# Patient Record
Sex: Female | Born: 1990 | Race: Black or African American | Hispanic: No | Marital: Single | Smoking: Former smoker
Health system: Southern US, Community
[De-identification: ages and names within clinical notes are randomized; demographics above are authoritative.]

## PROBLEM LIST (undated history)

## (undated) DIAGNOSIS — D649 Anemia, unspecified: Secondary | ICD-10-CM

## (undated) HISTORY — PX: FOOT SURGERY: SHX648

---
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
## 2010-06-04 ENCOUNTER — Emergency Department (HOSPITAL_COMMUNITY)
Admission: EM | Admit: 2010-06-04 | Discharge: 2010-06-04 | Payer: Self-pay | Source: Home / Self Care | Admitting: Emergency Medicine

## 2010-06-05 LAB — URINALYSIS, ROUTINE W REFLEX MICROSCOPIC
Bilirubin Urine: NEGATIVE
Ketones, ur: 15 mg/dL — AB
Leukocytes, UA: NEGATIVE
Nitrite: NEGATIVE
Protein, ur: 30 mg/dL — AB
Specific Gravity, Urine: >1.03 — ABNORMAL HIGH (ref 1.005–1.030)
Urine Glucose, Fasting: NEGATIVE mg/dL
Urobilinogen, UA: 0.2 mg/dL (ref 0.0–1.0)
pH: 5.5 (ref 5.0–8.0)

## 2010-06-05 LAB — BASIC METABOLIC PANEL
BUN: 8 mg/dL (ref 6–23)
CO2: 22 mEq/L (ref 19–32)
Calcium: 8.7 mg/dL (ref 8.4–10.5)
Chloride: 104 mEq/L (ref 96–112)
Creatinine, Ser: 0.84 mg/dL (ref 0.4–1.2)
GFR calc Af Amer: >60 mL/min (ref >60)
GFR calc non Af Amer: >60 mL/min (ref >60)
Glucose, Bld: 98 mg/dL (ref 70–99)
Potassium: 3.4 mEq/L — ABNORMAL LOW (ref 3.5–5.1)
Sodium: 136 mEq/L (ref 135–145)

## 2010-06-05 LAB — CBC
HCT: 29.9 % — ABNORMAL LOW (ref 36.0–46.0)
Hemoglobin: 9.5 g/dL — ABNORMAL LOW (ref 12.0–15.0)
MCH: 22 pg — ABNORMAL LOW (ref 26.0–34.0)
MCHC: 31.8 g/dL (ref 30.0–36.0)
MCV: 69.2 fL — ABNORMAL LOW (ref 78.0–100.0)
Platelets: 251 10*3/uL (ref 150–400)
RBC: 4.32 MIL/uL (ref 3.87–5.11)
RDW: 20 % — ABNORMAL HIGH (ref 11.5–15.5)
WBC: 13.3 10*3/uL — ABNORMAL HIGH (ref 4.0–10.5)

## 2010-06-05 LAB — DIFFERENTIAL
Basophils Absolute: 0 10*3/uL (ref 0.0–0.1)
Basophils Relative: 0 % (ref 0–1)
Eosinophils Absolute: 0.1 10*3/uL (ref 0.0–0.7)
Eosinophils Relative: 1 % (ref 0–5)
Lymphocytes Relative: 8 % — ABNORMAL LOW (ref 12–46)
Lymphs Abs: 1 10*3/uL (ref 0.7–4.0)
Monocytes Absolute: 0.9 10*3/uL (ref 0.1–1.0)
Monocytes Relative: 7 % (ref 3–12)
Neutro Abs: 11.2 10*3/uL — ABNORMAL HIGH (ref 1.7–7.7)
Neutrophils Relative %: 84 % — ABNORMAL HIGH (ref 43–77)

## 2010-06-05 LAB — URINE MICROSCOPIC-ADD ON

## 2010-06-05 LAB — OCCULT BLOOD, POC DEVICE: Fecal Occult Bld: POSITIVE

## 2010-06-05 LAB — POCT PREGNANCY, URINE: Preg Test, Ur: NEGATIVE

## 2010-06-07 LAB — URINE CULTURE
Colony Count: >=100000
Culture  Setup Time: 201201160040

## 2010-08-05 LAB — URINALYSIS, ROUTINE W REFLEX MICROSCOPIC
Glucose, UA: NEGATIVE mg/dL
Ketones, ur: 15 mg/dL — AB
Nitrite: NEGATIVE
Protein, ur: NEGATIVE mg/dL
Specific Gravity, Urine: 1.031 — ABNORMAL HIGH (ref 1.005–1.030)
Urobilinogen, UA: 1 mg/dL (ref 0.0–1.0)
pH: 5.5 (ref 5.0–8.0)

## 2010-08-05 LAB — DIFFERENTIAL
Basophils Absolute: 0 10*3/uL (ref 0.0–0.1)
Basophils Relative: 0 % (ref 0–1)
Eosinophils Absolute: 0.1 10*3/uL (ref 0.0–0.7)
Eosinophils Relative: 1 % (ref 0–5)
Lymphocytes Relative: 15 % (ref 12–46)
Lymphs Abs: 1.5 10*3/uL (ref 0.7–4.0)
Monocytes Absolute: 1.1 10*3/uL — ABNORMAL HIGH (ref 0.1–1.0)
Monocytes Relative: 10 % (ref 3–12)
Neutro Abs: 7.5 10*3/uL (ref 1.7–7.7)
Neutrophils Relative %: 73 % (ref 43–77)

## 2010-08-05 LAB — URINE MICROSCOPIC-ADD ON

## 2010-08-05 LAB — CBC
HCT: 27.9 % — ABNORMAL LOW (ref 36.0–46.0)
Hemoglobin: 9.1 g/dL — ABNORMAL LOW (ref 12.0–15.0)
MCH: 23.7 pg — ABNORMAL LOW (ref 26.0–34.0)
MCHC: 32.7 g/dL (ref 30.0–36.0)
MCV: 72.5 fL — ABNORMAL LOW (ref 78.0–100.0)
Platelets: 275 10*3/uL (ref 150–400)
RBC: 3.85 MIL/uL — ABNORMAL LOW (ref 3.87–5.11)
RDW: 18.3 % — ABNORMAL HIGH (ref 11.5–15.5)
WBC: 10.2 10*3/uL (ref 4.0–10.5)

## 2010-08-05 LAB — WET PREP, GENITAL
Trich, Wet Prep: NONE SEEN
Yeast Wet Prep HPF POC: NONE SEEN

## 2010-08-05 LAB — POCT I-STAT, CHEM 8
BUN: 16 mg/dL (ref 6–23)
Calcium, Ion: 1.22 mmol/L (ref 1.12–1.32)
Chloride: 108 mEq/L (ref 96–112)
Creatinine, Ser: 1 mg/dL (ref 0.4–1.2)
Glucose, Bld: 94 mg/dL (ref 70–99)
HCT: 31 % — ABNORMAL LOW (ref 36.0–46.0)
Hemoglobin: 10.5 g/dL — ABNORMAL LOW (ref 12.0–15.0)
Potassium: 3.8 mEq/L (ref 3.5–5.1)
Sodium: 142 mEq/L (ref 135–145)
TCO2: 25 mmol/L (ref 0–100)

## 2010-08-05 LAB — GC/CHLAMYDIA PROBE AMP, GENITAL
Chlamydia, DNA Probe: POSITIVE — AB
GC Probe Amp, Genital: POSITIVE — AB

## 2010-08-05 LAB — POCT PREGNANCY, URINE: Preg Test, Ur: NEGATIVE

## 2010-08-10 ENCOUNTER — Inpatient Hospital Stay (INDEPENDENT_AMBULATORY_CARE_PROVIDER_SITE_OTHER)
Admission: RE | Admit: 2010-08-10 | Discharge: 2010-08-10 | Disposition: A | Discharge status: Home / Self Care | Payer: Self-pay | Source: Ambulatory Visit | Attending: Family Medicine | Admitting: Family Medicine

## 2010-08-10 DIAGNOSIS — N739 Female pelvic inflammatory disease, unspecified: Secondary | ICD-10-CM

## 2010-08-10 LAB — POCT PREGNANCY, URINE: Preg Test, Ur: NEGATIVE

## 2010-08-10 LAB — POCT URINALYSIS DIP (DEVICE)
Bilirubin Urine: NEGATIVE
Glucose, UA: NEGATIVE mg/dL
Ketones, ur: NEGATIVE mg/dL
Nitrite: NEGATIVE
Protein, ur: NEGATIVE mg/dL
Specific Gravity, Urine: >=1.03 (ref 1.005–1.030)
Urobilinogen, UA: 1 mg/dL (ref 0.0–1.0)
pH: 6 (ref 5.0–8.0)

## 2010-08-10 LAB — WET PREP, GENITAL
Trich, Wet Prep: NONE SEEN
WBC, Wet Prep HPF POC: NONE SEEN
Yeast Wet Prep HPF POC: NONE SEEN

## 2010-08-11 LAB — GC/CHLAMYDIA PROBE AMP, GENITAL
Chlamydia, DNA Probe: NEGATIVE
GC Probe Amp, Genital: NEGATIVE

## 2011-01-15 ENCOUNTER — Emergency Department (HOSPITAL_COMMUNITY): Payer: Self-pay

## 2011-01-15 ENCOUNTER — Emergency Department (HOSPITAL_COMMUNITY)
Admission: EM | Admit: 2011-01-15 | Discharge: 2011-01-15 | Disposition: A | Discharge status: Home / Self Care | Payer: Self-pay | Attending: Emergency Medicine | Admitting: Emergency Medicine

## 2011-01-15 DIAGNOSIS — R51 Headache: Secondary | ICD-10-CM | POA: Insufficient documentation

## 2011-01-15 DIAGNOSIS — IMO0001 Reserved for inherently not codable concepts without codable children: Secondary | ICD-10-CM | POA: Insufficient documentation

## 2011-01-15 DIAGNOSIS — R5383 Other fatigue: Secondary | ICD-10-CM | POA: Insufficient documentation

## 2011-01-15 DIAGNOSIS — J3489 Other specified disorders of nose and nasal sinuses: Secondary | ICD-10-CM | POA: Insufficient documentation

## 2011-01-15 DIAGNOSIS — R059 Cough, unspecified: Secondary | ICD-10-CM | POA: Insufficient documentation

## 2011-01-15 DIAGNOSIS — R110 Nausea: Secondary | ICD-10-CM | POA: Insufficient documentation

## 2011-01-15 DIAGNOSIS — J189 Pneumonia, unspecified organism: Secondary | ICD-10-CM | POA: Insufficient documentation

## 2011-01-15 DIAGNOSIS — R05 Cough: Secondary | ICD-10-CM | POA: Insufficient documentation

## 2011-01-15 DIAGNOSIS — R070 Pain in throat: Secondary | ICD-10-CM | POA: Insufficient documentation

## 2011-01-15 DIAGNOSIS — R5381 Other malaise: Secondary | ICD-10-CM | POA: Insufficient documentation

## 2011-01-15 LAB — URINALYSIS, ROUTINE W REFLEX MICROSCOPIC
Ketones, ur: 15 mg/dL — AB
Leukocytes, UA: NEGATIVE
Nitrite: NEGATIVE
Protein, ur: NEGATIVE mg/dL
Urobilinogen, UA: 0.2 mg/dL (ref 0.0–1.0)

## 2011-01-15 LAB — RAPID STREP SCREEN (MED CTR MEBANE ONLY): Streptococcus, Group A Screen (Direct): NEGATIVE

## 2011-01-16 LAB — URINE CULTURE
Colony Count: >=100000
Culture  Setup Time: 201208271825

## 2011-05-12 ENCOUNTER — Emergency Department (HOSPITAL_COMMUNITY)
Admission: EM | Admit: 2011-05-12 | Discharge: 2011-05-12 | Disposition: A | Discharge status: Home / Self Care | Payer: Self-pay | Attending: Emergency Medicine | Admitting: Emergency Medicine

## 2011-05-12 ENCOUNTER — Emergency Department (HOSPITAL_COMMUNITY): Payer: Self-pay

## 2011-05-12 DIAGNOSIS — R509 Fever, unspecified: Secondary | ICD-10-CM | POA: Insufficient documentation

## 2011-05-12 DIAGNOSIS — J45909 Unspecified asthma, uncomplicated: Secondary | ICD-10-CM | POA: Insufficient documentation

## 2011-05-12 DIAGNOSIS — IMO0001 Reserved for inherently not codable concepts without codable children: Secondary | ICD-10-CM | POA: Insufficient documentation

## 2011-05-12 DIAGNOSIS — J069 Acute upper respiratory infection, unspecified: Secondary | ICD-10-CM | POA: Insufficient documentation

## 2011-05-12 DIAGNOSIS — R51 Headache: Secondary | ICD-10-CM | POA: Insufficient documentation

## 2011-05-12 DIAGNOSIS — J3489 Other specified disorders of nose and nasal sinuses: Secondary | ICD-10-CM | POA: Insufficient documentation

## 2011-05-12 DIAGNOSIS — R112 Nausea with vomiting, unspecified: Secondary | ICD-10-CM | POA: Insufficient documentation

## 2011-05-12 MED ORDER — IBUPROFEN 200 MG PO TABS
600.0000 mg | ORAL_TABLET | Freq: Once | ORAL | Status: AC
  Administered 2011-05-12: 600 mg via ORAL
  Filled 2011-05-12: qty 3

## 2011-05-12 MED ORDER — ALBUTEROL SULFATE (5 MG/ML) 0.5% IN NEBU
2.5000 mg | INHALATION_SOLUTION | Freq: Once | RESPIRATORY_TRACT | Status: AC
  Administered 2011-05-12: 2.5 mg via RESPIRATORY_TRACT
  Filled 2011-05-12: qty 0.5

## 2011-05-12 MED ORDER — BENZONATATE 100 MG PO CAPS: 100.0000 mg | ORAL_CAPSULE | Freq: Three times a day (TID) | ORAL | Status: AC

## 2011-05-12 MED ORDER — IPRATROPIUM BROMIDE 0.02 % IN SOLN
0.5000 mg | Freq: Once | RESPIRATORY_TRACT | Status: AC
  Administered 2011-05-12: 0.5 mg via RESPIRATORY_TRACT
  Filled 2011-05-12: qty 2.5

## 2011-05-12 MED ORDER — ALBUTEROL SULFATE HFA 108 (90 BASE) MCG/ACT IN AERS
1.0000 | INHALATION_SPRAY | RESPIRATORY_TRACT | Status: DC
  Administered 2011-05-12: 2 via RESPIRATORY_TRACT
  Filled 2011-05-12: qty 6.7

## 2011-05-12 MED ORDER — GUAIFENESIN 100 MG/5ML PO LIQD: 100.0000 mg | ORAL | Status: AC | PRN

## 2011-05-12 NOTE — ED Notes (Signed)
Neb treatment completed at this time, Pt resting with eyes closed.

## 2011-05-12 NOTE — ED Provider Notes (Signed)
Medical screening examination/treatment/procedure(s) were performed by non-physician practitioner and as supervising physician I was immediately available for consultation/collaboration.   Jalila Goodnough, MD 05/12/11 2200 

## 2011-05-12 NOTE — ED Notes (Signed)
Chills, fever, n/v/d and body aches for 3 days
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
## 2011-05-12 NOTE — ED Provider Notes (Signed)
History     CSN: [id number]  Arrival date & time 05/12/11  0901   First MD Initiated Contact with Patient 05/12/11 1054      Chief Complaint  Patient presents with  . Influenza    (Consider location/radiation/quality/duration/timing/severity/associated sxs/prior treatment) Patient is a 20 y.o. female presenting with flu symptoms.  Influenza Associated symptoms include chills, congestion, a fever, headaches and myalgias. Pertinent negatives include no abdominal pain, chest pain, fatigue, nausea, neck pain, rash, sore throat, vomiting or weakness.  History is obtained from the patient and mom. She has had flulike symptoms for the past 3 days. These have consisted of chills, nausea and vomiting, and body aches. Mom states that she had a "high fever "last evening, she was very warm to touch; however, she did not measure the temperature. She is taking over-the-counter medications, which have not helped.  She additionally complains of a headache which has been there for the past 3 days. It is frontal in nature and located behind the left eye. It is throbbing in nature. She has no history of migraines. Denies sinus pain or pressure. Headache does not seem to wax and wane in intensity. She denies photophobia/photophobia. Denies vertigo, weakness.  Past Medical History  Diagnosis Date  . Asthma     History reviewed. No pertinent past surgical history.  History reviewed. No pertinent family history.  History  Substance Use Topics  . Smoking status: Current Everyday Smoker  . Smokeless tobacco: Not on file  . Alcohol Use: Yes    OB History    Grav Para Term Preterm Abortions TAB SAB Ect Mult Living                  Review of Systems  Constitutional: Positive for fever, chills and appetite change. Negative for activity change, fatigue and unexpected weight change.  HENT: Positive for congestion and rhinorrhea. Negative for hearing loss, ear pain, sore throat, trouble swallowing,  neck pain, neck stiffness and sinus pressure.   Eyes: Negative for photophobia and visual disturbance.  Respiratory: Negative for chest tightness and shortness of breath.   Cardiovascular: Negative for chest pain and palpitations.  Gastrointestinal: Negative for nausea, vomiting and abdominal pain.  Genitourinary: Negative for dysuria.  Musculoskeletal: Positive for myalgias.  Skin: Negative for color change and rash.  Neurological: Positive for headaches. Negative for dizziness and weakness.    Allergies  Review of patient's allergies indicates no known allergies.  Home Medications  No current outpatient prescriptions on file.  BP 127/77  Pulse 98  Temp(Src) 99.1 F (37.3 C) (Oral)  Resp 18  SpO2 98%  LMP 04/21/2011  Physical Exam  Nursing note and vitals reviewed. Constitutional: She is oriented to person, place, and time. She appears well-developed and well-nourished. No distress.  HENT:  Head: Normocephalic and atraumatic.  Right Ear: External ear normal.  Left Ear: External ear normal.  Nose: Nose normal.  Mouth/Throat: Oropharynx is clear and moist. No oropharyngeal exudate.       No tenderness to palp on scalp palpation  Eyes: Conjunctivae and EOM are normal. Pupils are equal, round, and reactive to light.  Neck: Normal range of motion. Neck supple. No tracheal deviation present.  Cardiovascular: Normal rate, regular rhythm and normal heart sounds.  Exam reveals no gallop and no friction rub.   No murmur heard. Pulmonary/Chest: Effort normal. No respiratory distress. She has wheezes. She exhibits no tenderness.       Diffuse wheezing worst in upper lung fields b/l  Abdominal: Soft. Bowel sounds are normal. There is no tenderness. There is no rebound and no guarding.  Musculoskeletal: Normal range of motion.  Lymphadenopathy:    She has no cervical adenopathy.  Neurological: She is alert and oriented to person, place, and time. No cranial nerve deficit.  Skin: Skin  is warm and dry. No rash noted. She is not diaphoretic.  Psychiatric: She has a normal mood and affect.    ED Course  Procedures (including critical care time)  Labs Reviewed - No data to display Dg Chest 2 View  05/12/2011  *RADIOLOGY REPORT*  Clinical Data: Wheezing, cough and fever.  CHEST - 2 VIEW  Comparison: PA and lateral chest 01/15/2011.  Findings: Left lower lobe airspace disease seen on the prior study has resolved.  Lungs are clear.  Heart size normal.  No pneumothorax or effusion.  IMPRESSION: No acute disease.  Original Report Authenticated By: Bernadene Bell. D'ALESSIO, M.D.     1. URI (upper respiratory infection)       MDM  11:53 AM Patient assessed. VSS, nontoxic appearing. She is noted to have wheezing bilaterally, worse in the upper lung fields. She does have a history of asthma. States that she does not have an inhaler to use at home. Plan to obtain chest x-ray to rule out pneumonia or other acute infectious process.  2:21 PM Patient's chest x-ray was clear. She was given one nebulizer treatment, and lung sounds were considerably clearer afterwards. Vital signs are stable. Suspect viral infection. Will plan to treat symptomatically. Findings and plan discussed with patient and mom at bedside. They verbalized understanding and agreed to plan.      Grant Fontana, [place] 05/12/11 1705

## 2011-07-28 ENCOUNTER — Emergency Department (HOSPITAL_COMMUNITY): Payer: Self-pay

## 2011-07-28 ENCOUNTER — Encounter (HOSPITAL_COMMUNITY): Payer: Self-pay | Admitting: Emergency Medicine

## 2011-07-28 ENCOUNTER — Emergency Department (HOSPITAL_COMMUNITY)
Admission: EM | Admit: 2011-07-28 | Discharge: 2011-07-28 | Disposition: A | Discharge status: Home / Self Care | Payer: Self-pay | Attending: Emergency Medicine | Admitting: Emergency Medicine

## 2011-07-28 DIAGNOSIS — S022XXA Fracture of nasal bones, initial encounter for closed fracture: Secondary | ICD-10-CM | POA: Insufficient documentation

## 2011-07-28 DIAGNOSIS — R51 Headache: Secondary | ICD-10-CM | POA: Insufficient documentation

## 2011-07-28 DIAGNOSIS — R042 Hemoptysis: Secondary | ICD-10-CM | POA: Insufficient documentation

## 2011-07-28 DIAGNOSIS — S0083XA Contusion of other part of head, initial encounter: Secondary | ICD-10-CM

## 2011-07-28 DIAGNOSIS — S0003XA Contusion of scalp, initial encounter: Secondary | ICD-10-CM | POA: Insufficient documentation

## 2011-07-28 LAB — URINE MICROSCOPIC-ADD ON

## 2011-07-28 LAB — URINALYSIS, ROUTINE W REFLEX MICROSCOPIC
Bilirubin Urine: NEGATIVE
Ketones, ur: NEGATIVE mg/dL
Nitrite: NEGATIVE
Urobilinogen, UA: 0.2 mg/dL (ref 0.0–1.0)
pH: 7 (ref 5.0–8.0)

## 2011-07-28 MED ORDER — HYDROCODONE-ACETAMINOPHEN 5-325 MG PO TABS: ORAL_TABLET | ORAL | Status: AC
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
## 2011-07-28 NOTE — Discharge Instructions (Signed)
RESOURCE GUIDE  Dental Problems  Patients with Medicaid: [facility]                     [facility] [address]                                           [address] Phone:  [phone]                                                  Phone:  [phone]  If unable to pay or uninsured, contact:  [facility] or [facility] to become qualified for the adult dental clinic.  Chronic Pain Problems Contact [facility]  [phone] Patients need to be referred by their primary care doctor.  Insufficient Money for Medicine Contact [employer]:  call "211" or [facility] 271-5999.  No Primary Care Doctor Call [facility]  832-8000 Other agencies that provide inexpensive medical care    [facility]  [phone]    [facility]  [phone]    [facility]  [phone]    Women[facility] Clinic  [phone]    [facility]  [phone]    [facility]  [phone]  Psychological Services [facility]  [phone] [facility]  [phone] [facility]   800 853-5163 (emergency services 641-4993)  Substance Abuse Resources [facility]  336-882-2125 [facility] 336-784-9470 [facility] 336-285-9073 [facility] 336-845-3988 Residential & Outpatient Substance Abuse Program  800-659-3381  Abuse/Neglect [place] Child Abuse Hotline (336) 641-3795 [place] Child Abuse Hotline 800-378-5315 (After Hours)  Emergency Shelter [facility] (336) 271-5985  Maternity Homes [facility] (336) 275-9566 [facility] (704) 372-4663  MRSA Hotline #:   832-7006    [place] Resources  [facility] [place]     [employer]                          [facility] [address] [place]                       [address]      [address]  [place]                                                [place]                            [place] Phone:  [phone]                                   Phone:  [phone]                 Phone:  [phone]  [facility] Phone:  [phone]    [facility] Child Abuse Hotline 605-317-0757 402-805-0899 (After Hours)    Take the prescription as directed.  You will need to keep your head elevated for the next few days, especially at night, to help decrease the swelling on your face.  Continue to apply ice several times per day for 15 to 20 minutes each time.  Call your regular medical doctor on Monday to schedule a follow up appointment this week.   You will also want to call the ENT doctor on Monday to schedule a follow up appointment within the next week regarding your nasal bone fracture.  Return to the Emergency Department immediately if worsening.

## 2011-07-28 NOTE — ED Notes (Signed)
Patient transported to CT 
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
## 2011-12-05 ENCOUNTER — Emergency Department (HOSPITAL_COMMUNITY)
Admission: EM | Admit: 2011-12-05 | Discharge: 2011-12-05 | Disposition: A | Discharge status: Home / Self Care | Payer: Self-pay | Attending: Emergency Medicine | Admitting: Emergency Medicine

## 2011-12-05 ENCOUNTER — Encounter (HOSPITAL_COMMUNITY): Payer: Self-pay | Admitting: *Deleted

## 2011-12-05 DIAGNOSIS — J45909 Unspecified asthma, uncomplicated: Secondary | ICD-10-CM | POA: Insufficient documentation

## 2011-12-05 DIAGNOSIS — F17200 Nicotine dependence, unspecified, uncomplicated: Secondary | ICD-10-CM | POA: Insufficient documentation

## 2011-12-05 DIAGNOSIS — D649 Anemia, unspecified: Secondary | ICD-10-CM | POA: Insufficient documentation

## 2011-12-05 DIAGNOSIS — M549 Dorsalgia, unspecified: Secondary | ICD-10-CM | POA: Insufficient documentation

## 2011-12-05 DIAGNOSIS — N939 Abnormal uterine and vaginal bleeding, unspecified: Secondary | ICD-10-CM | POA: Insufficient documentation

## 2011-12-05 DIAGNOSIS — N390 Urinary tract infection, site not specified: Secondary | ICD-10-CM | POA: Insufficient documentation

## 2011-12-05 DIAGNOSIS — N926 Irregular menstruation, unspecified: Secondary | ICD-10-CM | POA: Insufficient documentation

## 2011-12-05 LAB — CBC WITH DIFFERENTIAL/PLATELET
Basophils Relative: 0 % (ref 0–1)
Eosinophils Relative: 1 % (ref 0–5)
Hemoglobin: 6.7 g/dL — CL (ref 12.0–15.0)
Lymphocytes Relative: 18 % (ref 12–46)
MCH: 18.8 pg — ABNORMAL LOW (ref 26.0–34.0)
Monocytes Absolute: 0.8 10*3/uL (ref 0.1–1.0)
Neutrophils Relative %: 73 % (ref 43–77)
RBC: 3.56 MIL/uL — ABNORMAL LOW (ref 3.87–5.11)

## 2011-12-05 LAB — URINALYSIS, ROUTINE W REFLEX MICROSCOPIC
Glucose, UA: NEGATIVE mg/dL
Specific Gravity, Urine: 1.029 (ref 1.005–1.030)
pH: 5 (ref 5.0–8.0)

## 2011-12-05 LAB — BASIC METABOLIC PANEL
CO2: 24 mEq/L (ref 19–32)
Chloride: 105 mEq/L (ref 96–112)
Potassium: 3.7 mEq/L (ref 3.5–5.1)
Sodium: 139 mEq/L (ref 135–145)

## 2011-12-05 LAB — URINE MICROSCOPIC-ADD ON

## 2011-12-05 LAB — WET PREP, GENITAL
Trich, Wet Prep: NONE SEEN
Yeast Wet Prep HPF POC: NONE SEEN

## 2011-12-05 MED ORDER — ONDANSETRON 4 MG PO TBDP
8.0000 mg | ORAL_TABLET | Freq: Once | ORAL | Status: AC
  Administered 2011-12-05: 8 mg via ORAL
  Filled 2011-12-05: qty 2

## 2011-12-05 MED ORDER — CIPROFLOXACIN HCL 500 MG PO TABS: 500.0000 mg | ORAL_TABLET | Freq: Two times a day (BID) | ORAL | Status: AC

## 2011-12-05 MED ORDER — CIPROFLOXACIN HCL 500 MG PO TABS
500.0000 mg | ORAL_TABLET | Freq: Once | ORAL | Status: AC
  Administered 2011-12-05: 500 mg via ORAL
  Filled 2011-12-05: qty 1

## 2011-12-05 MED ORDER — IBUPROFEN 600 MG PO TABS: 600.0000 mg | ORAL_TABLET | Freq: Four times a day (QID) | ORAL | Status: AC | PRN

## 2011-12-05 MED ORDER — MEDROXYPROGESTERONE ACETATE 5 MG PO TABS: 5.0000 mg | ORAL_TABLET | Freq: Every day | ORAL | Status: DC

## 2011-12-05 MED ORDER — IBUPROFEN 800 MG PO TABS
800.0000 mg | ORAL_TABLET | Freq: Once | ORAL | Status: AC
Start: 2011-12-05 — End: 2011-12-05
  Administered 2011-12-05: 800 mg via ORAL
  Filled 2011-12-05: qty 1

## 2011-12-05 NOTE — ED Provider Notes (Signed)
History     CSN: [id number]  Arrival date & time 12/05/11  0865   First MD Initiated Contact with Patient 12/05/11 1045      Chief Complaint  Patient presents with  . Abdominal Pain  . Back Pain    (Consider location/radiation/quality/duration/timing/severity/associated sxs/prior treatment) Patient is a 21 y.o. female presenting with abdominal pain and back pain. The history is provided by the patient. No language interpreter was used.  Abdominal Pain The primary symptoms of the illness include abdominal pain, nausea and vaginal bleeding. The primary symptoms of the illness do not include fever, vomiting, diarrhea or vaginal discharge. The current episode started more than 2 days ago. The onset of the illness was gradual.  Additional symptoms associated with the illness include back pain.  Back Pain  Associated symptoms include abdominal pain. Pertinent negatives include no fever.   Reports second day of her period with heavy bleeding as usual and suprapubic pain.  Nausea but no vomiting. States that the suprapubic pain has been for 5-6 days.  Denies dysuria, discharge, fever, dizziness  or severe pain.  She has taken nothing for pain.  States that she is anemic and should be on iron pills but quit taking them a year ago.  No pcp.   Past Medical History  Diagnosis Date  . Asthma     Past Surgical History  Procedure Date  . Foot surgery     No family history on file.  History  Substance Use Topics  . Smoking status: Current Everyday Smoker  . Smokeless tobacco: Not on file  . Alcohol Use: Yes    OB History    Grav Para Term Preterm Abortions TAB SAB Ect Mult Living                  Review of Systems  Constitutional: Negative for fever.  Gastrointestinal: Positive for nausea and abdominal pain. Negative for vomiting and diarrhea.  Genitourinary: Positive for vaginal bleeding. Negative for vaginal discharge.  Musculoskeletal: Positive for back pain.    Allergies   Review of patient's allergies indicates no known allergies.  Home Medications   Current Outpatient Rx  Name Route Sig Dispense Refill  . ALBUTEROL SULFATE HFA 108 (90 BASE) MCG/ACT IN AERS Inhalation Inhale 2 puffs into the lungs every 6 (six) hours as needed. For shortness of breath      BP 144/93  Pulse 92  Temp 99.2 F (37.3 C) (Oral)  Resp 16  Ht 5\' 4"  (1.626 m)  Wt 170 lb (77.111 kg)  BMI 29.18 kg/m2  SpO2 100%  Physical Exam  Nursing note and vitals reviewed. Constitutional: She is oriented to person, place, and time. She appears well-developed and well-nourished.  HENT:  Head: Normocephalic and atraumatic.  Eyes: Conjunctivae and EOM are normal. Pupils are equal, round, and reactive to light.  Neck: Normal range of motion. Neck supple.  Cardiovascular: Normal rate.   Pulmonary/Chest: Effort normal.  Abdominal: Soft. Bowel sounds are normal. She exhibits no distension. There is tenderness. There is no rebound and no guarding.  Musculoskeletal: Normal range of motion. She exhibits no edema and no tenderness.  Neurological: She is alert and oriented to person, place, and time. She has normal reflexes.  Skin: Skin is warm and dry.  Psychiatric: She has a normal mood and affect.    ED Course  Pelvic exam Date/Time: 12/05/2011 11:45 AM Performed by: Remi Haggard Authorized by: Remi Haggard Consent: Verbal consent obtained. Risks and benefits: risks, benefits  and alternatives were discussed Consent given by: patient Patient understanding: patient states understanding of the procedure being performed Patient identity confirmed: verbally with patient, arm band, provided demographic data and hospital-assigned identification number Time out: Immediately prior to procedure a "time out" was called to verify the correct patient, procedure, equipment, support staff and site/side marked as required. Preparation: Patient was prepped and draped in the usual sterile  fashion. Local anesthesia used: no Patient sedated: no Comments: Heavy vaginal bleeding.  Suprapubic pain on exam.     (including critical care time)  Labs Reviewed  CBC WITH DIFFERENTIAL - Abnormal; Notable for the following:    RBC 3.56 (*)     Hemoglobin 6.7 (*)     HCT 22.4 (*)     MCV 62.9 (*)     MCH 18.8 (*)     MCHC 29.9 (*)     RDW 19.6 (*)     All other components within normal limits  URINALYSIS, ROUTINE W REFLEX MICROSCOPIC - Abnormal; Notable for the following:    Color, Urine RED (*)  BIOCHEMICALS MAY BE AFFECTED BY COLOR   APPearance CLOUDY (*)     Hgb urine dipstick LARGE (*)     Bilirubin Urine LARGE (*)     Ketones, ur 40 (*)     Protein, ur >300 (*)     Nitrite POSITIVE (*)     Leukocytes, UA MODERATE (*)     All other components within normal limits  WET PREP, GENITAL - Abnormal; Notable for the following:    WBC, Wet Prep HPF POC FEW (*)     All other components within normal limits  URINE MICROSCOPIC-ADD ON - Abnormal; Notable for the following:    Squamous Epithelial / LPF FEW (*)     Bacteria, UA FEW (*)     All other components within normal limits  BASIC METABOLIC PANEL  PREGNANCY, URINE  GC/CHLAMYDIA PROBE AMP, GENITAL   No results found.   No diagnosis found.    MDM  UTI with heavy vaginal bleedging.  Anemic hgb 6.7.  Asymptomatic.  Will start taking her iron pills again and [facility]  Return if symptomatic.  Ibuprofen for pain.  Cipro for uti.  Non toxic appearance.  rx for provera for the heavy bleeding.  No insurance. No pcp.          Remi Haggard, NP 12/06/11 1250

## 2011-12-05 NOTE — ED Notes (Signed)
Patient complains of lower abd pain and lower back pain since last Thursday.  She started her period on yesterday.  She denies seeing discharge.  Patient states she has increased pain with cough/laugh

## 2011-12-05 NOTE — Progress Notes (Signed)
Met with patient to discuss her medical practices outside of coming to the ED. She does not have a PCP and states she does not have insurance. Patient does work but is not sure if they offer health insurance. Discussed [employer] and she expressed interest in learning about becoming enrolled. I have referred the patient to [facility].

## 2011-12-07 LAB — GC/CHLAMYDIA PROBE AMP, GENITAL: GC Probe Amp, Genital: POSITIVE — AB

## 2011-12-07 NOTE — ED Provider Notes (Signed)
Medical screening examination/treatment/procedure(s) were performed by non-physician practitioner and as supervising physician I was immediately available for consultation/collaboration.   Dione Booze, MD 12/07/11 1425

## 2011-12-08 NOTE — ED Notes (Signed)
+  Chlamydia and +Gonorrhea. Chart sent to EDP office for review. DHHS attached x 2. °

## 2011-12-11 NOTE — ED Notes (Signed)
rx for Azithromycin gram po daily once Doxycyline 100 mg twice daily for 7 daily called to [address]- [phone] by Patty PFM.

## 2012-07-25 IMAGING — CR DG CHEST 2V
2 series · 2 of 2 positions shown · non-contrast
Comparison: PA and lateral chest 01/15/2011.

CLINICAL DATA: Wheezing, cough and fever.

CHEST - 2 VIEW

[w chest pa]
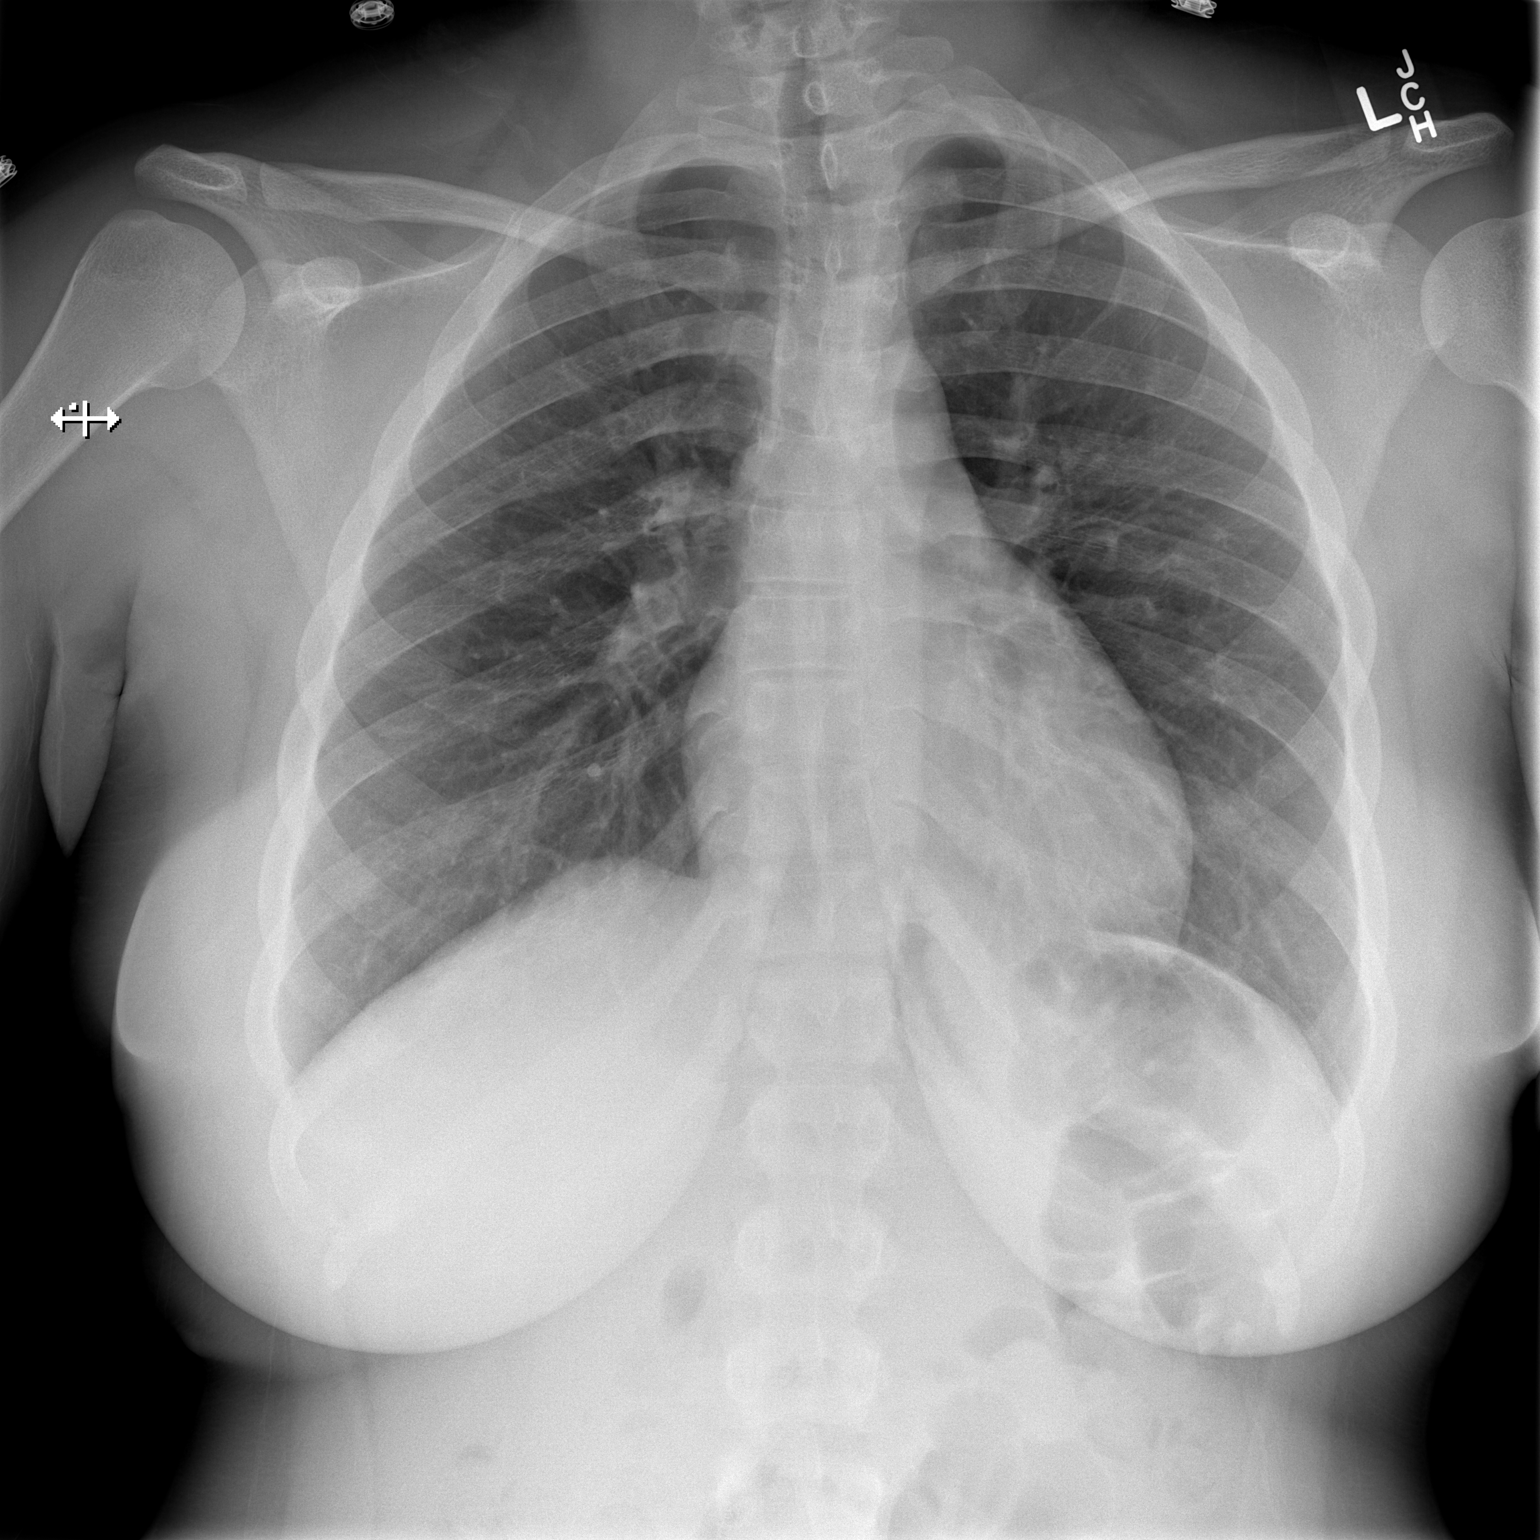

[w chest lat]
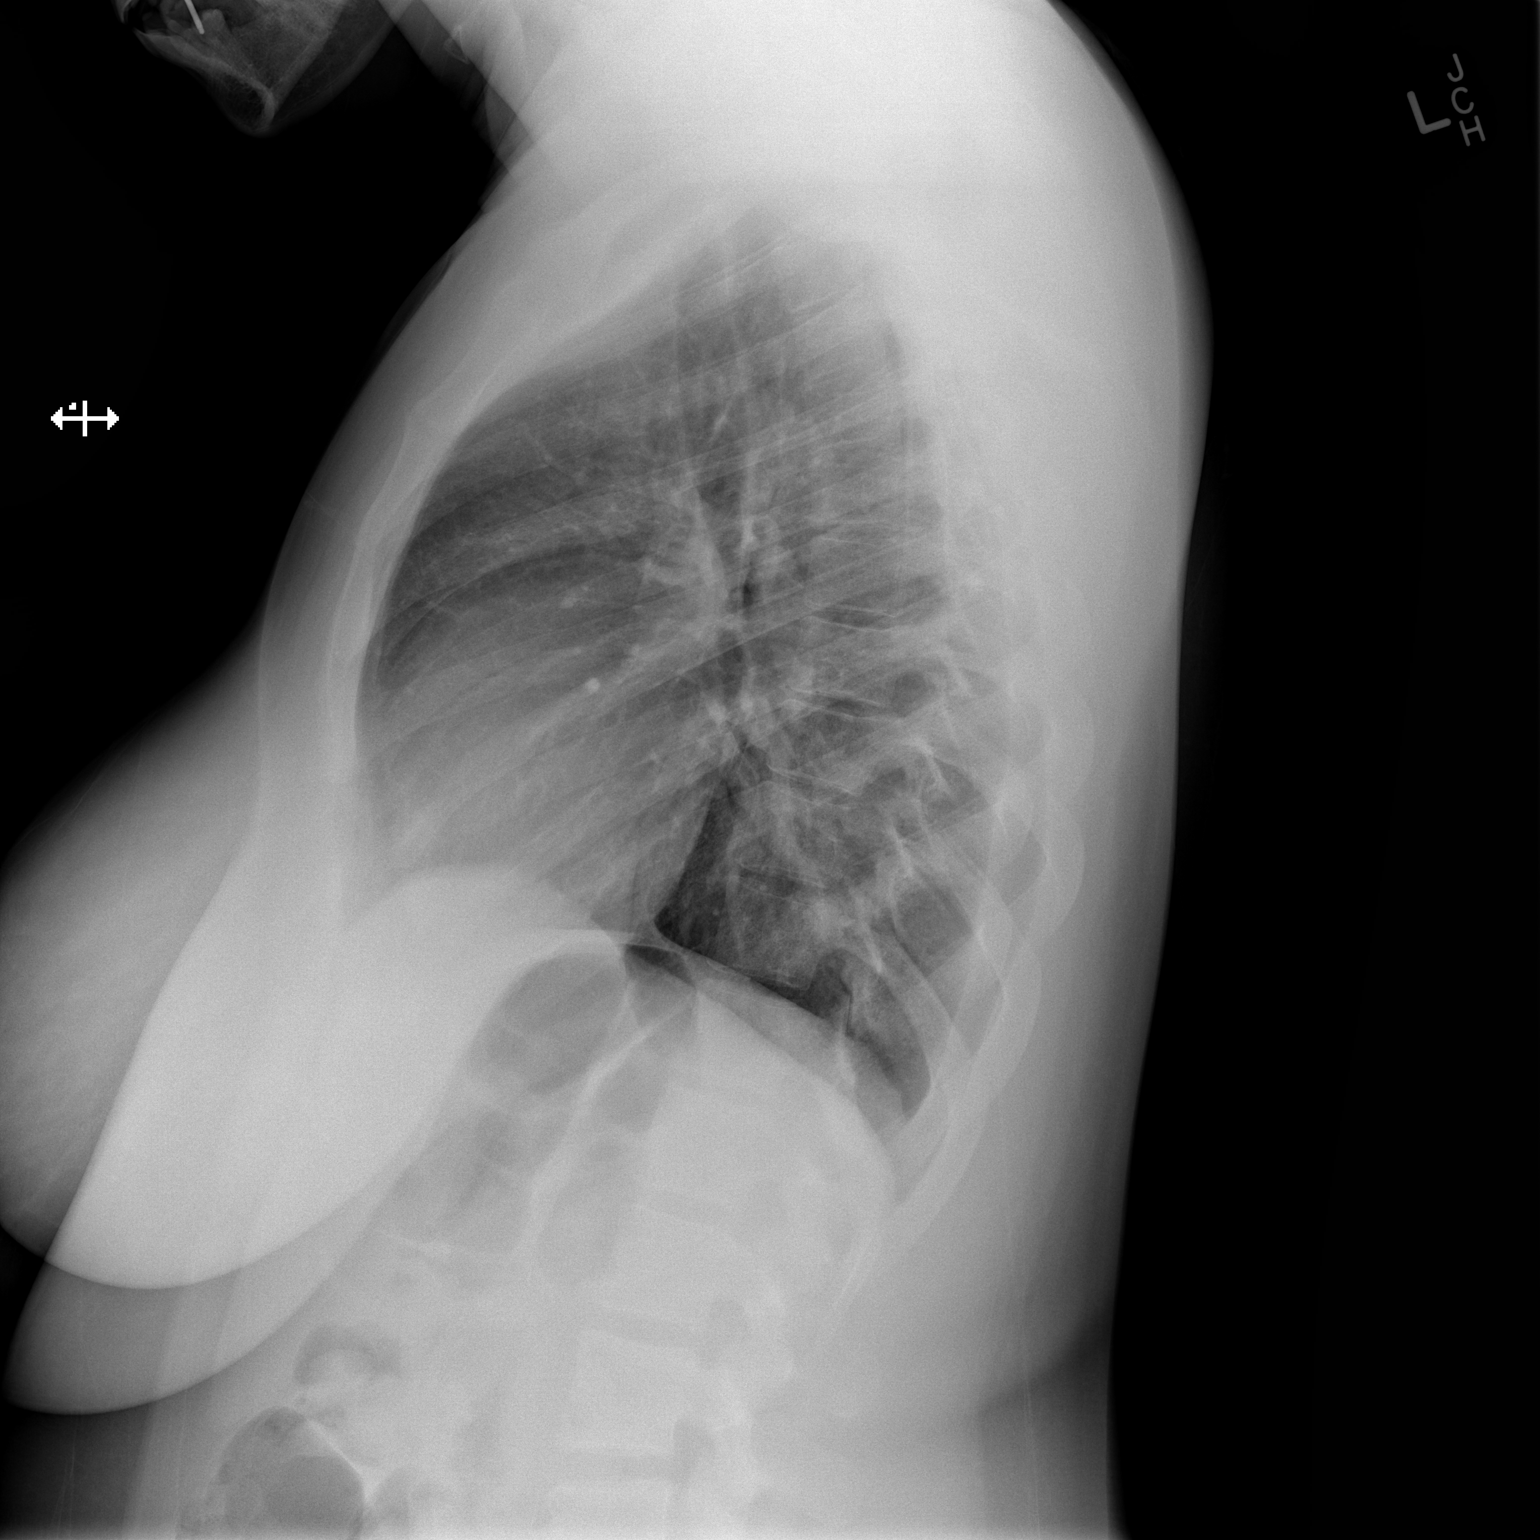

[2 of 2 positions shown; findings below may reference images not displayed]

FINDINGS: Left lower lobe airspace disease seen on the prior study
has resolved.  Lungs are clear.  Heart size normal.  No
pneumothorax or effusion.
IMPRESSION: No acute disease.

## 2014-04-22 ENCOUNTER — Encounter (HOSPITAL_BASED_OUTPATIENT_CLINIC_OR_DEPARTMENT_OTHER): Payer: Self-pay | Admitting: *Deleted

## 2014-04-22 ENCOUNTER — Emergency Department (HOSPITAL_BASED_OUTPATIENT_CLINIC_OR_DEPARTMENT_OTHER)
Admission: EM | Admit: 2014-04-22 | Discharge: 2014-04-22 | Disposition: A | Discharge status: Home / Self Care | Payer: Self-pay | Attending: Emergency Medicine | Admitting: Emergency Medicine

## 2014-04-22 DIAGNOSIS — Z79899 Other long term (current) drug therapy: Secondary | ICD-10-CM | POA: Insufficient documentation

## 2014-04-22 DIAGNOSIS — J45909 Unspecified asthma, uncomplicated: Secondary | ICD-10-CM | POA: Insufficient documentation

## 2014-04-22 DIAGNOSIS — N938 Other specified abnormal uterine and vaginal bleeding: Secondary | ICD-10-CM | POA: Insufficient documentation

## 2014-04-22 DIAGNOSIS — Z720 Tobacco use: Secondary | ICD-10-CM | POA: Insufficient documentation

## 2014-04-22 DIAGNOSIS — D500 Iron deficiency anemia secondary to blood loss (chronic): Secondary | ICD-10-CM | POA: Insufficient documentation

## 2014-04-22 LAB — CBC WITH DIFFERENTIAL/PLATELET
BASOS PCT: 0 % (ref 0–1)
Basophils Absolute: 0 10*3/uL (ref 0.0–0.1)
EOS PCT: 3 % (ref 0–5)
Eosinophils Absolute: 0.1 10*3/uL (ref 0.0–0.7)
HEMATOCRIT: 18.7 % — AB (ref 36.0–46.0)
HEMOGLOBIN: 5.3 g/dL — AB (ref 12.0–15.0)
LYMPHS ABS: 1.8 10*3/uL (ref 0.7–4.0)
Lymphocytes Relative: 39 % (ref 12–46)
MCH: 16.6 pg — ABNORMAL LOW (ref 26.0–34.0)
MCHC: 28.3 g/dL — ABNORMAL LOW (ref 30.0–36.0)
MCV: 58.4 fL — AB (ref 78.0–100.0)
MONOS PCT: 12 % (ref 3–12)
Monocytes Absolute: 0.5 10*3/uL (ref 0.1–1.0)
NEUTROS ABS: 2.1 10*3/uL (ref 1.7–7.7)
Neutrophils Relative %: 46 % (ref 43–77)
PLATELETS: 324 10*3/uL (ref 150–400)
RBC: 3.2 MIL/uL — AB (ref 3.87–5.11)
RDW: 28.4 % — ABNORMAL HIGH (ref 11.5–15.5)
WBC: 4.5 10*3/uL (ref 4.0–10.5)

## 2014-04-22 LAB — URINALYSIS, ROUTINE W REFLEX MICROSCOPIC
Bilirubin Urine: NEGATIVE
GLUCOSE, UA: NEGATIVE mg/dL
Ketones, ur: NEGATIVE mg/dL
LEUKOCYTES UA: NEGATIVE
Nitrite: NEGATIVE
PROTEIN: NEGATIVE mg/dL
SPECIFIC GRAVITY, URINE: 1.025 (ref 1.005–1.030)
Urobilinogen, UA: 1 mg/dL (ref 0.0–1.0)
pH: 7.5 (ref 5.0–8.0)

## 2014-04-22 LAB — URINE MICROSCOPIC-ADD ON

## 2014-04-22 LAB — WET PREP, GENITAL
CLUE CELLS WET PREP: NONE SEEN
Trich, Wet Prep: NONE SEEN
Yeast Wet Prep HPF POC: NONE SEEN

## 2014-04-22 LAB — BASIC METABOLIC PANEL
Anion gap: 14 (ref 5–15)
BUN: 17 mg/dL (ref 6–23)
CO2: 21 meq/L (ref 19–32)
Calcium: 8.9 mg/dL (ref 8.4–10.5)
Chloride: 106 mEq/L (ref 96–112)
Creatinine, Ser: 0.8 mg/dL (ref 0.50–1.10)
GFR calc Af Amer: >90 mL/min (ref >90)
GFR calc non Af Amer: >90 mL/min (ref >90)
GLUCOSE: 101 mg/dL — AB (ref 70–99)
POTASSIUM: 3.8 meq/L (ref 3.7–5.3)
SODIUM: 141 meq/L (ref 137–147)

## 2014-04-22 LAB — HCG, SERUM, QUALITATIVE: PREG SERUM: NEGATIVE

## 2014-04-22 MED ORDER — MEGESTROL ACETATE 40 MG PO TABS: 80.0000 mg | ORAL_TABLET | Freq: Three times a day (TID) | ORAL | Status: AC

## 2014-04-22 MED ORDER — FERROUS SULFATE 325 (65 FE) MG PO TABS: 325.0000 mg | ORAL_TABLET | Freq: Every day | ORAL | Status: AC

## 2014-04-22 NOTE — ED Notes (Signed)
md notified of critical low hgb result

## 2014-04-22 NOTE — ED Provider Notes (Signed)
CSN: [id number]     Arrival date & time 04/22/14  2020 History   This chart was scribed for Arby BarretteMarcy Arrayah Connors, MD by Evon Slackerrance Branch, ED Scribe. This patient was seen in room MH11/MH11 and the patient's care was started at 8:38 PM.      Chief Complaint  Patient presents with  . Vaginal Bleeding   Patient is a 23 y.o. female presenting with vaginal bleeding. The history is provided by the patient. No language interpreter was used.  Vaginal Bleeding Associated symptoms: abdominal pain and nausea   Associated symptoms: no dysuria, no fever and no vaginal discharge    HPI Comments: Rachell Ciprolecia M Robers is a 23 y.o. female who presents to the Emergency Department complaining of vaginal bleeding onset 1 day ago. She states that her menstrual cycle began yesterday and is heavier than normal. She states she has used an entire box of tampons and pads since yesterday sense beginning her menstrual cycle. She states she has had associated nausea and abdominal cramping that has now resolved. She states that she is passing blood clots as well. She states she is sexually active. Denies chance of being pregnant due to being on birth control. Denies light headedness, dysuria, fever chills,vomiting, cough or any other related symptoms. She states that about 1.5 year ago she went to [facility] and was given something to thin out the vaginal bleeding during her menstrual cycles.    Past Medical History  Diagnosis Date  . Asthma    Past Surgical History  Procedure Laterality Date  . Foot surgery     No family history on file. History  Substance Use Topics  . Smoking status: Current Every Day Smoker    Types: Cigarettes  . Smokeless tobacco: Not on file  . Alcohol Use: Yes     Comment: 1-2 drinks/day   OB History    No data available     Review of Systems  Constitutional: Negative for fever and chills.  Gastrointestinal: Positive for nausea and abdominal pain. Negative for vomiting.  Genitourinary:  Positive for vaginal bleeding. Negative for dysuria and vaginal discharge.  Neurological: Negative for light-headedness.    Allergies  Review of patient's allergies indicates no known allergies.  Home Medications   Prior to Admission medications   Medication Sig Start Date End Date Taking? Authorizing Provider  albuterol (PROVENTIL HFA;VENTOLIN HFA) 108 (90 BASE) MCG/ACT inhaler Inhale 2 puffs into the lungs every 6 (six) hours as needed. For shortness of breath   Yes Historical Provider, MD  ferrous sulfate 325 (65 FE) MG tablet Take 1 tablet (325 mg total) by mouth daily. 04/22/14   Arby BarretteMarcy Yared Susan, MD  medroxyPROGESTERone (PROVERA) 5 MG tablet Take 1 tablet (5 mg total) by mouth daily. 12/05/11 12/04/12  Remi HaggardAnne Crawford, NP  megestrol (MEGACE) 40 MG tablet Take 2 tablets (80 mg total) by mouth 3 (three) times daily. Take 2 tablets by mouth 3 times daily until vaginal bleeding subsides. At that time continue 80 mg twice daily for 4 days. Then continue with 40 mg daily for 7 days. 04/22/14 05/13/14  Arby BarretteMarcy Omair Dettmer, MD   Triage Vitals: BP 167/92 mmHg  Pulse 87  Temp(Src) 98.1 F (36.7 C) (Oral)  Resp 18  Ht 5\' 4"  (1.626 m)  Wt 187 lb (84.823 kg)  BMI 32.08 kg/m2  SpO2 100%  LMP 04/21/2014  Physical Exam  Constitutional: She is oriented to person, place, and time. She appears well-developed and well-nourished.  HENT:  Head: Normocephalic and atraumatic.  Eyes: EOM are normal. Pupils are equal, round, and reactive to light.  Neck: Neck supple.  Cardiovascular: Normal rate, regular rhythm, normal heart sounds and intact distal pulses.   Pulmonary/Chest: Effort normal and breath sounds normal.  Abdominal: Soft. Bowel sounds are normal. She exhibits no distension. There is no tenderness.  Genitourinary:  Normal external vaginal genitalia. Speculum examination: Moderate dark blood in the vaginal vault. Small to moderate clot present. No clot within the cervical os. No active or brisk bleeding  from the os.  Musculoskeletal: Normal range of motion. She exhibits no edema.  Neurological: She is alert and oriented to person, place, and time. She has normal strength. Coordination normal. GCS eye subscore is 4. GCS verbal subscore is 5. GCS motor subscore is 6.  Skin: Skin is warm, dry and intact.  Psychiatric: She has a normal mood and affect.    ED Course  Procedures (including critical care time) DIAGNOSTIC STUDIES: Oxygen Saturation is 100% on RA, normal by my interpretation.    COORDINATION OF CARE:    Labs Review Labs Reviewed  WET PREP, GENITAL - Abnormal; Notable for the following:    WBC, Wet Prep HPF POC FEW (*)    All other components within normal limits  GC/CHLAMYDIA PROBE AMP - Abnormal; Notable for the following:    CT Probe RNA POSITIVE (*)    All other components within normal limits  BASIC METABOLIC PANEL - Abnormal; Notable for the following:    Glucose, Bld 101 (*)    All other components within normal limits  CBC WITH DIFFERENTIAL - Abnormal; Notable for the following:    RBC 3.20 (*)    Hemoglobin 5.3 (*)    HCT 18.7 (*)    MCV 58.4 (*)    MCH 16.6 (*)    MCHC 28.3 (*)    RDW 28.4 (*)    All other components within normal limits  URINALYSIS, ROUTINE W REFLEX MICROSCOPIC - Abnormal; Notable for the following:    Hgb urine dipstick LARGE (*)    All other components within normal limits  URINE MICROSCOPIC-ADD ON - Abnormal; Notable for the following:    Bacteria, UA FEW (*)    All other components within normal limits  HCG, SERUM, QUALITATIVE    Imaging Review No results found.   EKG Interpretation None     Consult: [facility] Mac[facility]anwu [facility] reports patient can take Megestrol 80mg  TID and taper. Reports doesn't need transfusion id clinically asymptomatic. MDM   Final diagnoses:  Dysfunctional uterine bleeding  Iron deficiency anemia due to chronic blood loss   Patient presents as outlined above. She is significantly anemic. She  however has no associated symptoms of anemia. I [facility]. At this time with the patient asymptomatic with her anemia she advises for outpatient treatment with Megace. The patient was reassessed and found to be clinically well in appearance. She has been critically anemic in the past due to dysfunctional uterine bleeding. She prefers a plan of outpatient medical management. She is counseled on the signs and symptoms for which to return including continued bleeding.     Arby BarretteMarcy Muslima Toppins, MD 04/27/14 [phone]

## 2014-04-22 NOTE — ED Notes (Signed)
MD at bedside. 

## 2014-04-22 NOTE — Discharge Instructions (Signed)
Anemia, Nonspecific Anemia is a condition in which the concentration of red blood cells or hemoglobin in the blood is below normal. Hemoglobin is a substance in red blood cells that carries oxygen to the tissues of the body. Anemia results in not enough oxygen reaching these tissues.  CAUSES  Common causes of anemia include:   Excessive bleeding. Bleeding may be internal or external. This includes excessive bleeding from periods (in women) or from the intestine.   Poor nutrition.   Chronic kidney, thyroid, and liver disease.  Bone marrow disorders that decrease red blood cell production.  Cancer and treatments for cancer.  HIV, AIDS, and their treatments.  Spleen problems that increase red blood cell destruction.  Blood disorders.  Excess destruction of red blood cells due to infection, medicines, and autoimmune disorders. SIGNS AND SYMPTOMS   Minor weakness.   Dizziness.   Headache.  Palpitations.   Shortness of breath, especially with exercise.   Paleness.  Cold sensitivity.  Indigestion.  Nausea.  Difficulty sleeping.  Difficulty concentrating. Symptoms may occur suddenly or they may develop slowly.  DIAGNOSIS  Additional blood tests are often needed. These help your health care provider determine the best treatment. Your health care provider will check your stool for blood and look for other causes of blood loss.  TREATMENT  Treatment varies depending on the cause of the anemia. Treatment can include:   Supplements of iron, vitamin V25, or folic acid.   Hormone medicines.   A blood transfusion. This may be needed if blood loss is severe.   Hospitalization. This may be needed if there is significant continual blood loss.   Dietary changes.  Spleen removal. HOME CARE INSTRUCTIONS Keep all follow-up appointments. It often takes many weeks to correct anemia, and having your health care provider check on your condition and your response to  treatment is very important. SEEK IMMEDIATE MEDICAL CARE IF:   You develop extreme weakness, shortness of breath, or chest pain.   You become dizzy or have trouble concentrating.  You develop heavy vaginal bleeding.   You develop a rash.   You have bloody or black, tarry stools.   You faint.   You vomit up blood.   You vomit repeatedly.   You have abdominal pain.  You have a fever or persistent symptoms for more than 2-3 days.   You have a fever and your symptoms suddenly get worse.   You are dehydrated.  MAKE SURE YOU:  Understand these instructions.  Will watch your condition.  Will get help right away if you are not doing well or get worse. Document Released: 06/14/2004 Document Revised: 01/07/2013 Document Reviewed: 10/31/2012 [facility] Patient Information 2015 [place], [place]. This information is not intended to replace advice given to you by your health care provider. Make sure you discuss any questions you have with your health care provider.  Abnormal Uterine Bleeding Abnormal uterine bleeding means bleeding from the vagina that is not your normal menstrual period. This can be:  Bleeding or spotting between periods.  Bleeding after sex (sexual intercourse).  Bleeding that is heavier or more than normal.  Periods that last longer than usual.  Bleeding after menopause. There are many problems that may cause this. Treatment will depend on the cause of the bleeding. Any kind of bleeding that is not normal should be reviewed by your doctor.  HOME CARE Watch your condition for any changes. These actions may lessen any discomfort you are having:  Do not use tampons or  douches as told by your doctor.  Change your pads often. You should get regular pelvic exams and Pap tests. Keep all appointments for tests as told by your doctor. GET HELP IF:  You are bleeding for more than 1 week.  You feel dizzy at times. GET HELP RIGHT AWAY IF:   You pass  out.  You have to change pads every 15 to 30 minutes.  You have belly pain.  You have a fever.  You become sweaty or weak.  You are passing large blood clots from the vagina.  You feel sick to your stomach (nauseous) and throw up (vomit). MAKE SURE YOU:  Understand these instructions.  Will watch your condition.  Will get help right away if you are not doing well or get worse. Document Released: 03/04/2009 Document Revised: 05/12/2013 Document Reviewed: 12/04/2012 [facility] Patient Information 2015 [place], [place]. This information is not intended to replace advice given to you by your health care provider. Make sure you discuss any questions you have with your health care provider.  Emergency Department Resource Guide 1) Find a Doctor and Pay Out of Pocket Although you won't have to find out who is covered by your insurance plan, it is a good idea to ask around and get recommendations. You will then need to call the office and see if the doctor you have chosen will accept you as a new patient and what types of options they offer for patients who are self-pay. Some doctors offer discounts or will set up payment plans for their patients who do not have insurance, but you will need to ask so you aren't surprised when you get to your appointment.  2) Contact Your Local Health Department Not all health departments have doctors that can see patients for sick visits, but many do, so it is worth a call to see if yours does. If you don't know where your local health department is, you can check in your phone book. The CDC also has a tool to help you locate your state's health department, and many state websites also have listings of all of their local health departments.  3) Find a Walk-in Clinic If your illness is not likely to be very severe or complicated, you may want to try a walk in clinic. These are popping up all over the country in pharmacies, drugstores, and shopping centers. They're  usually staffed by nurse practitioners or physician assistants that have been trained to treat common illnesses and complaints. They're usually fairly quick and inexpensive. However, if you have serious medical issues or chronic medical problems, these are probably not your best option.  No Primary Care Doctor: - Call Health Connect at  [phone] - they can help you locate a primary care doctor that  accepts your insurance, provides certain services, etc. - Physician Referral Service- [phone]  Chronic Pain Problems: Organization         Address  Phone   Notes  [facility]  [phone] Patients need to be referred by their primary care doctor.   Medication Assistance: Organization         Address  Phone   Notes  [facility] Medication [facility] [address] [place], [place] [address] [phone] --Must be a resident of [facility] -- Must have NO insurance coverage whatsoever (no Medicaid/ Medicare, etc.) -- The pt. MUST have a primary care doctor that directs their care regularly and follows them in the community  MedAssist  [phone]   [employer]  [phone]    Agencies that provide inexpensive medical care: Organization         Address  Phone   Notes  [facility]  [phone]   [facility]    [phone]   [facility] [address] [place], [place] [address] [phone]   [facility] [address], [place] [phone]   [facility]anned [facility]    [phone]) [phone]   [facility]    [phone]   [facility]  [address], [place] Phone:  [phone], Fax:  [phone] Hours of Operation:  9 am - 6 pm, M-F.  Also accepts Medicaid/Medicare and self-pay.  [facility]  [address], [place] Phone: [phone], Fax: [phone]) [phone]. Hours  of Operation:  8:30 am - 5:30 pm, M-F.  Also accepts Medicaid and self-pay.  [facility] [place] [address], [place] Phone: [phone]) [phone]   [facility] [address], [place], [place] [phone], Ext. [phone] Mondays & Thursdays: 7-9 AM.  First 15 patients are seen on a first come, first serve basis.    Medicaid-accepting [facility] Providers:  Organization         Address  Phone   Notes  [facility] [address], [place] [phone] Also accepts self-pay patients.  [facility] [address], [place]  [phone]   [facility] [address], [place] [phone]) [phone]   [facility] [address] [place] [address], [place] [phone]   Renaye RakersVeita Bland [address], [place]   [phone] Only accepts [place] Access [place] patients after they have their name applied to their card.   Self-Pay (no insurance) in [facility]:  Organization         Address  Phone   Notes  Sickle Cell Patients, [facility] [address], [place] [phone]   [facility] [address], [place] [phone]   [facility] [place]  [address], [place] [phone]   [facility]/Dr. Osei-Bonsu  [address] [place] [address], [place] or [phone], [place] [phone]) [phone] Phone number for both [place] and [place] locations is the same.  [facility] [address], [place] [phone]) [phone]   [facility] [place] [address] [place] [address], [place] or [address] [phone]   [facility] [address], [place] [phone], phone; [phone], fax Sees patients 1st and 3rd Saturday of every month.  Must not qualify for public or private insurance (i.e. Medicaid,  Medicare, [place] Health Choice, Veterans' Benefits)  Household income should be no more than 200% of the poverty level The clinic cannot treat you if you are pregnant or think you are pregnant  Sexually transmitted diseases are not treated at the clinic.    Dental Care: Organization         Address  Phone  Notes  [facility] [address], [place] [phone] Accepts children up to age 23 who are enrolled in [place]icaid or [place] Health Choice;  pregnant women with a Medicaid card; and children who have applied for Medicaid or [place] Health Choice, but were declined, whose parents can pay a reduced fee at time of service.  [facility] [place]  [address], [place] [phone]) [phone] Accepts children up to age 23 who are enrolled in [place]icaid or [place] Health Choice; pregnant women with a Medicaid card; and children who have applied for Medicaid or [place] Health Choice, but were declined, whose parents can pay a reduced fee at time of service.  [facility]  [address], [place] [phone]) [phone] Patients are seen by appointment only. Walk-ins are not accepted. [facility] will see patients 23 years of age and older. Monday - Tuesday (8am-5pm) Most Wednesdays (8:30-5pm) $30 per visit, cash only  [facility]  [address], [facility] [phone]) [phone] Patients are seen by appointment only. Walk-ins are not accepted. [facility] will see patients 23 years of age and older. One Wednesday Evening (Monthly: Volunteer Based).  $30 per visit, cash only  [employer] [facility]  [phone] for adults; Children under age 384, call Graduate Pediatric Dentistry at [phone]. Children aged 754-14, please call [phone] to request a pediatric application.  Dental services are provided in all areas of dental care including fillings, crowns  and bridges, complete and partial dentures, implants, gum treatment, root canals, and extractions. Preventive care is also provided. Treatment is provided to both adults and children. Patients are selected via a lottery and there is often a waiting list.   [facility] [address], [place]  [phone]) [phone] [email or web address]   [facility] [address], [place], [place] [phone], Ext. [phone] Second and Fourth Thursday of each month, opens at 6:30 AM; Clinic ends at 9 AM.  Patients are seen on a first-come first-served basis, and a limited number are seen during each clinic.   [facility]  [address], [place], [place] [phone]) [phone]   Eligibility Requirements You must have lived in [place], [place], or [place] counties for at least the last three months.   You cannot be eligible for state or federal sponsored National Cityhealthcare insurance, including CIGNAVeterans Administration, [place]icaid, or [employer]Medicare.   You generally cannot be eligible for healthcare insurance through your employer.    How to apply: Eligibility screenings are held every Tuesday and Wednesday afternoon from 1:00 pm until 4:00 pm. You do not need an appointment for the interview!  [facility] [address], [place], [place] [phone]   [facility]  [phone]   [facility]  [phone]   [facility]  [phone]    Behavioral Health Resources in the Community: Intensive Outpatient Programs Organization         Address  Phone  Notes  [facility] [address], [place], [place] [phone]   [facility] [address], [place], [place] [phone]   [facility] [address], [place], [place]  [phone]   [facility] [address],  [place], [place] [phone] or [phone]   Substance Abuse  Resources Organization         Address  Phone  Notes  [facility] Drug [facility]  [phone]   [facility]  [phone]   [facility]  [phone]  Floydene Flock  [phone]   Residential & Outpatient Substance Abuse Program  [phone]   Psychological Services Organization         Address  Phone  Notes  [facility]  [phone]   [facility]  [phone]   [facility] [address], [place] [phone] or [phone]    Mobile Crisis Teams Organization         Address  Phone  Notes  [facility], Mobile Crisis Care Unit  [phone]   [facility]  [address] [place], [place] [phone]   Doristine Locks [address] [place] [phone]    Self-Help/Support Groups Organization         Address  Phone             Notes  [facility] [place] - variety of support groups  [phone]- [phone] Call for more information  Narcotics Anonymous (NA), Caring Services [address], [employer] [place]  2 meetings at this location   Statistician         Address  Phone  Notes  [facility] [address],    [place]  [phone]   [facility]  [address], [place] [address], [place], [place] [phone]   [facility] [address] [place], [place] [phone] Admissions: 8am-3pm M-F  [facility] [address],    [place], [place] [phone]   [facility] [address] [place], [place], [place] [phone]   [facility] [address]  [place], [place] [phone]   [facility] - Intensive Outpatient [address], [place], [place] [phone]   [facility] [address]  [place], [place] [phone] or [phone]   [facility] ([facility]) [address] [place], [place] [phone] Accepts Medicaid  [facility] [place] [address]  [place] [phone] Substance Abuse/Addiction Treatment   [facility] Organization         Address  Phone  Notes  [facility]  [phone]   Angie Fava, PhD [address] [place], [place]   [phone] or [phone]   [facility]   [address] [place], [place] [phone]   [facility] [address] [place], [place] [phone] Insurance/Medicaid/sponsorship through [facility] and Families [address]                                    [place], [place] [phone] Therapy/tele-psych/case  [facility] [address], [place] [phone]    Dr. Lolly Mustache  [phone]   [facility]  United [employer] [facility] Dept. 1) [address], [place] 2) [address], [place] 3)  [address], [place] [phone]  [phone]   [facility] Child Abuse Hotline [phone])[phone] or [phone] (After Hours)

## 2014-04-22 NOTE — ED Notes (Signed)
Pt reports she is her menstrual cycle- bleeding has been very heavy- has used entire box of pads and tampons since yesterday

## 2014-04-24 LAB — GC/CHLAMYDIA PROBE AMP
CT Probe RNA: POSITIVE — AB
GC Probe RNA: NEGATIVE

## 2014-04-25 ENCOUNTER — Telehealth: Payer: Self-pay | Admitting: Emergency Medicine

## 2014-04-25 NOTE — Telephone Encounter (Signed)
Positive Chlamydia culture Chart sent to EDP for review 

## 2014-04-25 NOTE — Telephone Encounter (Signed)
Post ED Visit - Positive Culture Follow-up: Successful Patient Follow-Up  Positive Chalmydia culture  [x]  Patient discharged without antimicrobial prescription and treatment is now indicated []  Organism is resistant to prescribed ED discharge antimicrobial []  Patient with positive blood cultures  Changes discussed with ED provider: Ebbie Ridgehris Lawyer PA New antibiotic prescription:  Doxycycline 100 mg PO BID x seven days [facility] [place] [address] [place] [phone] - voicemail  Contacted patient, date 04/25/14, time 1600 ID verified, pt notified of positive Chlamydia and need for treatment. STD instructions provided, patient verbalized understanding. RX Doxycycline [facility] [phone] voicemail.   Claudia HaroldGammons, Claudia Stewart 04/25/2014, 4:06 PM

## 2014-05-03 ENCOUNTER — Inpatient Hospital Stay (HOSPITAL_COMMUNITY)
Admission: AD | Admit: 2014-05-03 | Discharge: 2014-05-03 | Disposition: A | Discharge status: Home / Self Care | Payer: Self-pay | Source: Ambulatory Visit | Attending: Family Medicine | Admitting: Family Medicine

## 2014-05-03 ENCOUNTER — Encounter (HOSPITAL_COMMUNITY): Payer: Self-pay | Admitting: *Deleted

## 2014-05-03 DIAGNOSIS — F17210 Nicotine dependence, cigarettes, uncomplicated: Secondary | ICD-10-CM | POA: Insufficient documentation

## 2014-05-03 DIAGNOSIS — D500 Iron deficiency anemia secondary to blood loss (chronic): Secondary | ICD-10-CM | POA: Insufficient documentation

## 2014-05-03 LAB — COMPREHENSIVE METABOLIC PANEL
ALK PHOS: 53 U/L (ref 39–117)
ALT: 12 U/L (ref 0–35)
ANION GAP: 12 (ref 5–15)
AST: 14 U/L (ref 0–37)
Albumin: 3.8 g/dL (ref 3.5–5.2)
BUN: 16 mg/dL (ref 6–23)
CALCIUM: 9 mg/dL (ref 8.4–10.5)
CO2: 21 mEq/L (ref 19–32)
Chloride: 106 mEq/L (ref 96–112)
Creatinine, Ser: 0.79 mg/dL (ref 0.50–1.10)
GFR calc non Af Amer: >90 mL/min (ref >90)
GLUCOSE: 110 mg/dL — AB (ref 70–99)
Potassium: 3.9 mEq/L (ref 3.7–5.3)
Sodium: 139 mEq/L (ref 137–147)
TOTAL PROTEIN: 7.7 g/dL (ref 6.0–8.3)
Total Bilirubin: 0.3 mg/dL (ref 0.3–1.2)

## 2014-05-03 LAB — URINALYSIS, ROUTINE W REFLEX MICROSCOPIC
Bilirubin Urine: NEGATIVE
Glucose, UA: NEGATIVE mg/dL
KETONES UR: NEGATIVE mg/dL
LEUKOCYTES UA: NEGATIVE
NITRITE: NEGATIVE
PH: 6 (ref 5.0–8.0)
PROTEIN: NEGATIVE mg/dL
Specific Gravity, Urine: >1.03 — ABNORMAL HIGH (ref 1.005–1.030)
Urobilinogen, UA: 0.2 mg/dL (ref 0.0–1.0)

## 2014-05-03 LAB — CBC
HEMATOCRIT: 19.7 % — AB (ref 36.0–46.0)
HEMOGLOBIN: 5.6 g/dL — AB (ref 12.0–15.0)
MCH: 17.3 pg — AB (ref 26.0–34.0)
MCHC: 28.4 g/dL — AB (ref 30.0–36.0)
MCV: 60.8 fL — AB (ref 78.0–100.0)
Platelets: 370 10*3/uL (ref 150–400)
RBC: 3.24 MIL/uL — ABNORMAL LOW (ref 3.87–5.11)
RDW: 30.5 % — ABNORMAL HIGH (ref 11.5–15.5)
WBC: 7.6 10*3/uL (ref 4.0–10.5)

## 2014-05-03 LAB — URINE MICROSCOPIC-ADD ON

## 2014-05-03 LAB — POCT PREGNANCY, URINE: Preg Test, Ur: NEGATIVE

## 2014-05-03 MED ORDER — SODIUM CHLORIDE 0.9 % IV SOLN
1020.0000 mg | Freq: Once | INTRAVENOUS | Status: AC
  Administered 2014-05-03: 1020 mg via INTRAVENOUS
  Filled 2014-05-03: qty 34

## 2014-05-03 NOTE — MAU Note (Signed)
PT  SAYS SHE BECAME   NAUSEA  TODAY   AT 12NOON    AFTER    SHE FAINTED.      SHE WAS AT [employer]-  FELT DIZZY  IN CAR-   AND FAINTED -  THINKS  SHE WAS OUT  X10 MIN.     THEN SHE DROVE TO WORK.      CONTINUED TO FEEL DIZZY-   SO LEFT AND CAME HERE.     LAST SEX-  11-28-     USES [place].    NO DR.

## 2014-05-03 NOTE — MAU Provider Note (Signed)
History     CSN: [id number]  Arrival date and time: 05/03/14 1700   First Provider Initiated Contact with Patient 05/03/14 2055      Chief Complaint  Patient presents with  . Nausea  . Dizziness   HPI  Claudia Stewart is a 23 y.o. a [id number] who presents today with dizziness. [facility], and she was standing in line. She started to feel dizzy and weak so, she went to her car. While sitting in her car she states that she passed out. She states that when she woke up she vomited. She then drove herself to work. She states that she went to work, and tried to work. However, she continued to feel dizzy. So, she left and came here.   She states that she is not having any vaginal bleeding. Her LMP was 04/21/14. She states that she has been taking the megace that was given in the ED couple of weeks ago. She states that her period was lighter. She has also been taking iron supplementation.   Past Medical History  Diagnosis Date  . Asthma     Past Surgical History  Procedure Laterality Date  . Foot surgery      History reviewed. No pertinent family history.  History  Substance Use Topics  . Smoking status: Current Every Day Smoker    Types: Cigarettes  . Smokeless tobacco: Not on file  . Alcohol Use: Yes     Comment: 1-2 drinks/day OF  BEER    Allergies: No Known Allergies  Prescriptions prior to admission  Medication Sig Dispense Refill Last Dose  . ferrous sulfate 325 (65 FE) MG tablet Take 1 tablet (325 mg total) by mouth daily. 30 tablet 0 05/03/2014 at Unknown time  . megestrol (MEGACE) 40 MG tablet Take 2 tablets (80 mg total) by mouth 3 (three) times daily. Take 2 tablets by mouth 3 times daily until vaginal bleeding subsides. At that time continue 80 mg twice daily for 4 days. Then continue with 40 mg daily for 7 days. 60 tablet 1 Past Week at Unknown time  . albuterol (PROVENTIL HFA;VENTOLIN HFA) 108 (90 BASE) MCG/ACT inhaler Inhale 2  puffs into the lungs every 6 (six) hours as needed. For shortness of breath   Rescue  . medroxyPROGESTERone (PROVERA) 5 MG tablet Take 1 tablet (5 mg total) by mouth daily. (Patient not taking: Reported on 05/03/2014) 5 tablet 0     ROS Physical Exam   Blood pressure 147/81, pulse 92, temperature 99.4 F (37.4 C), temperature source Oral, resp. rate 16, height 5\' 4"  (1.626 m), weight 84.823 kg (187 lb), last menstrual period 04/21/2014, SpO2 100 %.  Physical Exam  MAU Course  Procedures  Results for orders placed or performed during the hospital encounter of 05/03/14 (from the past 24 hour(s))  Urinalysis, Routine w reflex microscopic     Status: Abnormal   Collection Time: 05/03/14  7:30 PM  Result Value Ref Range   Color, Urine YELLOW YELLOW   APPearance CLEAR CLEAR   Specific Gravity, Urine >1.030 (H) 1.005 - 1.030   pH 6.0 5.0 - 8.0   Glucose, UA NEGATIVE NEGATIVE mg/dL   Hgb urine dipstick TRACE (A) NEGATIVE   Bilirubin Urine NEGATIVE NEGATIVE   Ketones, ur NEGATIVE NEGATIVE mg/dL   Protein, ur NEGATIVE NEGATIVE mg/dL   Urobilinogen, UA 0.2 0.0 - 1.0 mg/dL   Nitrite NEGATIVE NEGATIVE   Leukocytes, UA NEGATIVE NEGATIVE  Urine microscopic-add  on     Status: Abnormal   Collection Time: 05/03/14  7:30 PM  Result Value Ref Range   Squamous Epithelial / LPF FEW (A) RARE   WBC, UA 0-2 <3 WBC/hpf   Urine-Other MUCOUS PRESENT   Pregnancy, urine POC     Status: None   Collection Time: 05/03/14  7:35 PM  Result Value Ref Range   Preg Test, Ur NEGATIVE NEGATIVE  CBC     Status: Abnormal   Collection Time: 05/03/14  8:11 PM  Result Value Ref Range   WBC 7.6 4.0 - 10.5 K/uL   RBC 3.24 (L) 3.87 - 5.11 MIL/uL   Hemoglobin 5.6 (LL) 12.0 - 15.0 g/dL   HCT 16.119.7 (L) 09.636.0 - 04.546.0 %   MCV 60.8 (L) 78.0 - 100.0 fL   MCH 17.3 (L) 26.0 - 34.0 pg   MCHC 28.4 (L) 30.0 - 36.0 g/dL   RDW 40.930.5 (H) 81.111.5 - 91.415.5 %   Platelets 370 150 - 400 K/uL  Comprehensive metabolic panel     Status:  Abnormal   Collection Time: 05/03/14  8:11 PM  Result Value Ref Range   Sodium 139 137 - 147 mEq/L   Potassium 3.9 3.7 - 5.3 mEq/L   Chloride 106 96 - 112 mEq/L   CO2 21 19 - 32 mEq/L   Glucose, Bld 110 (H) 70 - 99 mg/dL   BUN 16 6 - 23 mg/dL   Creatinine, Ser 7.820.79 0.50 - 1.10 mg/dL   Calcium 9.0 8.4 - 95.610.5 mg/dL   Total Protein 7.7 6.0 - 8.3 g/dL   Albumin 3.8 3.5 - 5.2 g/dL   AST 14 0 - 37 U/L   ALT 12 0 - 35 U/L   Alkaline Phosphatase 53 39 - 117 U/L   Total Bilirubin 0.3 0.3 - 1.2 mg/dL   GFR calc non Af Amer >90 >90 mL/min   GFR calc Af Amer >90 >90 mL/min   Anion gap 12 5 - 15   2135: D/W Dr. Adrian BlackwaterStinson, transfusion would be appropriate that this time. Will come down to D/W the patient. 2150: Dr. Adrian BlackwaterStinson in to see the patient, will give IV iron. Offered patient admission with transfusion. Patient states that she cannot stay overnight. 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 Offered patient the option to come back another night. She does not feel like that would be possible either because of work and future court dates. 2250: IV iron infusion complete.    Assessment and Plan   1. Iron deficiency anemia due to chronic blood loss    Return to MAU as needed  Follow-up Information    [facility]   Specialty:  Obstetrics and Gynecology   Why:  Wednesday 05/12/14 at 1:00pm    Contact information:   [address] [place] [phone] [phone]       Tawnya CrookHogan, Sid Greener Donovan 05/03/2014, 8:57 PM

## 2014-05-03 NOTE — MAU Note (Signed)
Thressa ShellerHeather Hogan informed of critical hemoglobin of 5.6 was called from lab.

## 2014-05-03 NOTE — Discharge Instructions (Signed)
Iron Deficiency Anemia Anemia is a condition in which there are less red blood cells or hemoglobin in the blood than normal. Hemoglobin is the part of red blood cells that carries oxygen. Iron deficiency anemia is anemia caused by too little iron. It is the most common type of anemia. It may leave you tired and short of breath. CAUSES   Lack of iron in the diet.  Poor absorption of iron, as seen with intestinal disorders.  Intestinal bleeding.  Heavy periods. SIGNS AND SYMPTOMS  Mild anemia may not be noticeable. Symptoms may include:  Fatigue.  Headache.  Pale skin.  Weakness.  Tiredness.  Shortness of breath.  Dizziness.  Cold hands and feet.  Fast or irregular heartbeat. DIAGNOSIS  Diagnosis requires a thorough evaluation and physical exam by your health care provider. Blood tests are generally used to confirm iron deficiency anemia. Additional tests may be done to find the underlying cause of your anemia. These may include:  Testing for blood in the stool (fecal occult blood test).  A procedure to see inside the colon and rectum (colonoscopy).  A procedure to see inside the esophagus and stomach (endoscopy). TREATMENT  Iron deficiency anemia is treated by correcting the cause of the deficiency. Treatment may involve:  Adding iron-rich foods to your diet.  Taking iron supplements. Pregnant or breastfeeding women need to take extra iron because their normal diet usually does not provide the required amount.  Taking vitamins. Vitamin C improves the absorption of iron. Your health care provider may recommend that you take your iron tablets with a glass of orange juice or vitamin C supplement.  Medicines to make heavy menstrual flow lighter.  Surgery. HOME CARE INSTRUCTIONS   Take iron as directed by your health care provider.  If you cannot tolerate taking iron supplements by mouth, talk to your health care provider about taking them through a vein  (intravenously) or an injection into a muscle.  For the best iron absorption, iron supplements should be taken on an empty stomach. If you cannot tolerate them on an empty stomach, you may need to take them with food.  Do not drink milk or take antacids at the same time as your iron supplements. Milk and antacids may interfere with the absorption of iron.  Iron supplements can cause constipation. Make sure to include fiber in your diet to prevent constipation. A stool softener may also be recommended.  Take vitamins as directed by your health care provider.  Eat a diet rich in iron. Foods high in iron include liver, lean beef, whole-grain bread, eggs, dried fruit, and dark green leafy vegetables. SEEK IMMEDIATE MEDICAL CARE IF:   You faint. If this happens, do not drive. Call your local emergency services (911 in [place] if no other help is available.  You have chest pain.  You feel nauseous or vomit.  You have severe or increased shortness of breath with activity.  You feel weak.  You have a rapid heartbeat.  You have unexplained sweating.  You become light-headed when getting up from a chair or bed. MAKE SURE YOU:   Understand these instructions.  Will watch your condition.  Will get help right away if you are not doing well or get worse. Document Released: 05/04/2000 Document Revised: 05/12/2013 Document Reviewed: 01/12/2013 Exit[employer] Patient Information 2015 [employer]. This information is not intended to replace advice given to you by your health care provider. Make sure you discuss any questions you have with your health care provider.  

## 2014-05-03 NOTE — MAU Note (Signed)
No s/s of adverse reaction to Iron infusion.

## 2014-05-03 NOTE — MAU Note (Signed)
Patient presents with nausea, lightheadedness, dizziness and fainting intermittently X 3 years. Denies being pregnant. States that she fainted today. Denies bleeding or discharge.

## 2014-05-12 ENCOUNTER — Telehealth: Payer: Self-pay | Admitting: General Practice

## 2014-05-12 ENCOUNTER — Encounter: Payer: Self-pay | Admitting: Obstetrics & Gynecology

## 2014-05-12 NOTE — Telephone Encounter (Signed)
Patient no showed for appt today. Called patient and she states she had something come up and couldn't make it in for appt. Told patient I will let our front office know she needs an appt and they will be in touch with her. Told patient that we are closed the next 2 days so if her bleeding increases she should go back to MAU. Patient verbalized understanding and had no questions

## 2015-03-31 ENCOUNTER — Emergency Department (HOSPITAL_BASED_OUTPATIENT_CLINIC_OR_DEPARTMENT_OTHER)
Admission: EM | Admit: 2015-03-31 | Discharge: 2015-03-31 | Disposition: A | Discharge status: Home / Self Care | Payer: No Typology Code available for payment source | Attending: Emergency Medicine | Admitting: Emergency Medicine

## 2015-03-31 ENCOUNTER — Encounter (HOSPITAL_BASED_OUTPATIENT_CLINIC_OR_DEPARTMENT_OTHER): Payer: Self-pay

## 2015-03-31 DIAGNOSIS — Z87891 Personal history of nicotine dependence: Secondary | ICD-10-CM | POA: Diagnosis not present

## 2015-03-31 DIAGNOSIS — Y9389 Activity, other specified: Secondary | ICD-10-CM | POA: Insufficient documentation

## 2015-03-31 DIAGNOSIS — J45909 Unspecified asthma, uncomplicated: Secondary | ICD-10-CM | POA: Diagnosis not present

## 2015-03-31 DIAGNOSIS — Z79899 Other long term (current) drug therapy: Secondary | ICD-10-CM | POA: Insufficient documentation

## 2015-03-31 DIAGNOSIS — S4992XA Unspecified injury of left shoulder and upper arm, initial encounter: Secondary | ICD-10-CM | POA: Diagnosis present

## 2015-03-31 DIAGNOSIS — M546 Pain in thoracic spine: Secondary | ICD-10-CM

## 2015-03-31 DIAGNOSIS — D649 Anemia, unspecified: Secondary | ICD-10-CM | POA: Insufficient documentation

## 2015-03-31 DIAGNOSIS — S43402A Unspecified sprain of left shoulder joint, initial encounter: Secondary | ICD-10-CM | POA: Diagnosis not present

## 2015-03-31 DIAGNOSIS — Y92410 Unspecified street and highway as the place of occurrence of the external cause: Secondary | ICD-10-CM | POA: Diagnosis not present

## 2015-03-31 DIAGNOSIS — Y998 Other external cause status: Secondary | ICD-10-CM | POA: Insufficient documentation

## 2015-03-31 DIAGNOSIS — S29002A Unspecified injury of muscle and tendon of back wall of thorax, initial encounter: Secondary | ICD-10-CM | POA: Insufficient documentation

## 2015-03-31 HISTORY — DX: Anemia, unspecified: D64.9

## 2015-03-31 MED ORDER — CYCLOBENZAPRINE HCL 10 MG PO TABS: 10.0000 mg | ORAL_TABLET | Freq: Two times a day (BID) | ORAL | Status: AC | PRN

## 2015-03-31 MED ORDER — IBUPROFEN 600 MG PO TABS: 600.0000 mg | ORAL_TABLET | Freq: Four times a day (QID) | ORAL | Status: AC | PRN

## 2015-03-31 NOTE — ED Notes (Signed)
MD at bedside. 
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
## 2015-03-31 NOTE — Discharge Instructions (Signed)
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
## 2020-12-20 ENCOUNTER — Encounter: Payer: Self-pay | Admitting: Plastic Surgery

## 2020-12-20 ENCOUNTER — Ambulatory Visit (INDEPENDENT_AMBULATORY_CARE_PROVIDER_SITE_OTHER): Payer: BC Managed Care – PPO | Admitting: Plastic Surgery

## 2020-12-20 ENCOUNTER — Other Ambulatory Visit: Payer: Self-pay

## 2020-12-20 DIAGNOSIS — G8929 Other chronic pain: Secondary | ICD-10-CM

## 2020-12-20 DIAGNOSIS — N62 Hypertrophy of breast: Secondary | ICD-10-CM | POA: Diagnosis not present

## 2020-12-20 DIAGNOSIS — M546 Pain in thoracic spine: Secondary | ICD-10-CM | POA: Diagnosis not present

## 2020-12-20 DIAGNOSIS — M542 Cervicalgia: Secondary | ICD-10-CM | POA: Diagnosis not present

## 2020-12-20 DIAGNOSIS — M549 Dorsalgia, unspecified: Secondary | ICD-10-CM | POA: Insufficient documentation

## 2020-12-20 NOTE — Progress Notes (Signed)
Patient ID: Claudia Stewart, female    DOB: January 01, 1991, 29 y.o.   MRN: [id number]   Chief Complaint  Patient presents with   Advice Only   Breast Problem    Mammary Hyperplasia: The patient is a 30 y.o. female with a history of mammary hyperplasia for several years.  She has extremely large breasts causing symptoms that include the following: Back pain in the upper and lower back, including neck pain. She pulls or pins her bra straps to provide better lift and relief of the pressure and pain. She notices relief by holding her breast up manually.  Her shoulder straps cause grooves and pain and pressure that requires padding for relief. Pain medication is sometimes required with motrin and tylenol.  Activities that are hindered by enlarged breasts include: exercise and running.  She has tried supportive clothing as well as fitted bras without improvement.  Her breasts are extremely large and fairly symmetric.  She has hyperpigmentation of the inframammary area on both sides.  The sternal to nipple distance on the right is 35 cm and the left is 35 cm.  The IMF distance is 21 cm.  She is 5 feet 4 inches tall and weighs 201 pounds.  The BMI = 34.5.  Preoperative bra size = H cup. The patient would like to be a C\D cup.  The estimated excess breast tissue to be removed at the time of surgery = 630 grams on the left and 630 grams on the right.  Mammogram history: none.  Family history of breast cancer:  none.  Tobacco use:  yes.   The patient expresses the desire to pursue surgical intervention.  She had been to a chiropractor without improvement of her back pain.  She has elevated blood pressure but otherwise no chronic medical conditions.   Review of Systems  Constitutional:  Positive for activity change. Negative for appetite change.  HENT: Negative.    Eyes: Negative.   Respiratory: Negative.  Negative for chest tightness and shortness of breath.   Cardiovascular: Negative.  Negative for leg  swelling.  Gastrointestinal: Negative.   Endocrine: Negative.   Genitourinary: Negative.   Musculoskeletal:  Positive for back pain and neck pain.  Skin:  Positive for rash.  Neurological: Negative.   Hematological: Negative.   Psychiatric/Behavioral: Negative.     Past Medical History:  Diagnosis Date   Anemia    Asthma     Past Surgical History:  Procedure Laterality Date   FOOT SURGERY        Current Outpatient Medications:    Albuterol Sulfate (PROAIR RESPICLICK) 108 (90 Base) MCG/ACT AEPB, Inhale into the lungs., Disp: , Rfl:    cyclobenzaprine (FLEXERIL) 10 MG tablet, Take 1 tablet (10 mg total) by mouth 2 (two) times daily as needed for muscle spasms., Disp: 10 tablet, Rfl: 0   ferrous sulfate 325 (65 FE) MG tablet, Take 1 tablet (325 mg total) by mouth daily., Disp: 30 tablet, Rfl: 0   hydrochlorothiazide (HYDRODIURIL) 25 MG tablet, Take 1 tablet by mouth daily., Disp: , Rfl:    ibuprofen (ADVIL,MOTRIN) 600 MG tablet, Take 1 tablet (600 mg total) by mouth every 6 (six) hours as needed., Disp: 15 tablet, Rfl: 0   Multiple Vitamins-Minerals (MULTIVITAMIN ADULT, MINERALS,) TABS, Take 1 tablet by mouth daily., Disp: , Rfl:    Objective:   Vitals:   12/20/20 0828  BP: (!) 200/138  Pulse: 78  SpO2: 100%    Physical Exam Vitals  and nursing note reviewed.  Constitutional:      Appearance: Normal appearance.  HENT:     Head: Normocephalic and atraumatic.  Cardiovascular:     Rate and Rhythm: Normal rate.     Pulses: Normal pulses.  Pulmonary:     Effort: Pulmonary effort is normal.  Abdominal:     General: Abdomen is flat. There is no distension.     Palpations: There is no mass.     Tenderness: There is no abdominal tenderness.  Skin:    General: Skin is warm.     Capillary Refill: Capillary refill takes less than 2 seconds.     Coloration: Skin is not jaundiced.     Findings: No bruising.  Neurological:     General: No focal deficit present.     Mental  Status: She is alert and oriented to person, place, and time.  Psychiatric:        Mood and Affect: Mood normal.        Behavior: Behavior normal.    Assessment & Plan:  Chronic bilateral thoracic back pain  Symptomatic mammary hypertrophy  Neck pain  The procedure the patient selected and that was best for the patient was discussed. The risk were discussed and include but not limited to the following:  Breast asymmetry, fluid accumulation, firmness of the breast, inability to breast feed, loss of nipple or areola, skin loss, change in skin and nipple sensation, fat necrosis of the breast tissue, bleeding, infection and healing delay.  There are risks of anesthesia and injury to nerves or blood vessels.  Allergic reaction to tape, suture and skin glue are possible.  There will be swelling.  Any of these can lead to the need for revisional surgery.  A breast reduction has potential to interfere with diagnostic procedures in the future.  This procedure is best done when the breast is fully developed.  Changes in the breast will continue to occur over time: pregnancy, weight gain or weigh loss.    Total time: 45 minutes. This includes time spent with the patient during the visit as well as time spent before and after the visit reviewing the chart, documenting the encounter and ordering pertinent studies. and literature emailed to the patient.   Physical therapy:  ordered Mammogram:  not needed Healthy Weight and Wellness:  referral made  Patient will need to be a minimum of 3 months no smoking prior to surgery.  She also needs to complete the healthy weight and wellness for decreased her weight by 5 to 10 pounds.  Will also see her after her physical therapy.  She is a good candidate for bilateral breast reduction pending these things are completed.  Pictures were obtained of the patient and placed in the chart with the patient's or guardian's permission.      Alena Bills Renai Lopata, DO

## 2021-01-02 ENCOUNTER — Ambulatory Visit: Payer: BC Managed Care – PPO | Attending: Plastic Surgery

## 2021-01-02 ENCOUNTER — Other Ambulatory Visit: Payer: Self-pay

## 2021-01-02 DIAGNOSIS — M6281 Muscle weakness (generalized): Secondary | ICD-10-CM | POA: Insufficient documentation

## 2021-01-02 DIAGNOSIS — G8929 Other chronic pain: Secondary | ICD-10-CM | POA: Insufficient documentation

## 2021-01-02 DIAGNOSIS — M545 Low back pain, unspecified: Secondary | ICD-10-CM | POA: Diagnosis present

## 2021-01-02 DIAGNOSIS — M546 Pain in thoracic spine: Secondary | ICD-10-CM | POA: Insufficient documentation

## 2021-01-02 DIAGNOSIS — M542 Cervicalgia: Secondary | ICD-10-CM | POA: Diagnosis present

## 2021-01-02 NOTE — Therapy (Signed)
[facility] Outpatient [facility] [address] [place], [place], [address] Phone: [phone]   Fax:  [phone]  Physical Therapy Evaluation  Patient Details  Name: Claudia Stewart MRN: [id number] Date of Birth: 17-Apr-1991 Referring Provider (PT): Peggye Form, DO   Encounter Date: 01/02/2021   PT End of Session - 01/02/21 1155     Visit Number 1    Number of Visits 6    Date for PT Re-Evaluation 02/27/21    Authorization Type BCBS    PT Start Time 1020    PT Stop Time 1050    PT Time Calculation (min) 30 min    Activity Tolerance Patient tolerated treatment well;No increased pain    Behavior During Therapy WFL for tasks assessed/performed             Past Medical History:  Diagnosis Date   Anemia    Asthma     Past Surgical History:  Procedure Laterality Date   FOOT SURGERY      There were no vitals filed for this visit.    Subjective Assessment - 01/02/21 0922     Subjective Pt presents to PT with progressively worsening for the past few years d/t large breast size. She promotes occasional paresthesias in L LE, but this does not happen often. 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 Denies bowel/bladder changes or saddle anesthesia.    Pertinent History chronic hx of increasing mid and lower back pain    Limitations Standing;Walking;Lifting    How long can you sit comfortably? 60 minutes    How long can you stand comfortably? 60 minutes    How long can you walk comfortably? 30 minutes    Patient Stated Goals Pt would like to decrease back pain in order to improve comfort and activity    Currently in Pain? Yes    Pain Score 5    9/10 at worst   Pain Location Back    Pain Orientation Mid;Lower    Pain Type Chronic pain    Pain Onset More than a month ago    Pain Frequency Constant    Aggravating Factors  prolonged standing, movement     Pain Relieving Factors medication, massage gun                [facility] PT Assessment - 01/02/21 0001       Assessment   Medical Diagnosis N62 (ICD-10-CM) - Hypertrophy of breast  M54.2 (ICD-10-CM) - Cervicalgia  M54.6 (ICD-10-CM) - Pain in thoracic spine  G89.29 (ICD-10-CM) - Other chronic pain    Referring Provider (PT) Dillingham, Alena Bills, DO    Hand Dominance Right      Precautions   Precautions None      Restrictions   Weight Bearing Restrictions No      Balance Screen   Has the patient fallen in the past 6 months No    Has the patient had a decrease in activity level because of a fear of falling?  No    Is the patient reluctant to leave their home because of a fear of falling?  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[employer]; Chief of Staff      Observation/Other [employer] on Therapeutic Outcomes (FOTO)  No FOTO - Mammary Hypertrophy diagnosis    Other Surveys  Oswestry Disability Index    Oswestry Disability Index  62% disability      Sensation   Light Touch Appears Intact      AROM   Overall AROM Comments WNL      Strength   Overall Strength Comments UE MMT: 4/5; rhomboids: 4/5      Palpation   Palpation comment TTP to mid thoracic paraspinals along with bilateral upper traps      Special Tests    Special Tests Lumbar    Lumbar Tests Straight Leg Raise      Straight Leg Raise   Findings Positive    Comment bilateral      Transfers   Comments 30 Sec STS: 9 reps w/ 8/10 LBP increase           [facility] Adult PT Treatment/Exercise:   Therapeutic Exercise: supine PPT x 10 - 5 sec hold Bridge x 10 Rows x 10 - blue tband Seated horizontal abd red  tband Corner pec stretch x 30 sec            Objective measurements completed on examination: See above findings.               PT Education - 01/02/21 1507     Education Details eval findings, HEP, POC    Person(s) Educated Patient    Methods Explanation;Demonstration;Handout    Comprehension Verbalized understanding;Returned demonstration              PT Short Term Goals - 01/02/21 1156       PT SHORT TERM GOAL #1   Title Pt will be compliant and knowledgeable with initial HEP in order to improve comfort and carryover    Baseline initial HEP given    Time 3    Period Weeks    Status New    Target Date 01/23/21      PT SHORT TERM GOAL #2   Title Pt will increase reps in 30 Sec STS to no less than 15 with no increase in pain in order to improve strength and functional mobility    Baseline 30 Sec STS: 9 reps w/ 8/10 LBP increase    Time 3    Period Weeks    Status New    Target Date 01/23/21               PT Long Term Goals - 01/02/21 1157       PT LONG TERM GOAL #1   Title Pt will decrease ODI score to no greater than 40% in order to improve confidence and functional ability    Baseline 62% disability    Time 8    Period Weeks    Status New    Target Date 02/27/21      PT LONG TERM GOAL #2   Title Pt will self report back pain no greater than 3/10 at worst in order to improve comfort and functional ability    Baseline 9/10 at worst    Time 8    Period Weeks    Status New    Target Date 02/27/21      PT LONG TERM GOAL #3   Title  Pt will increase periscapular strength to 5/5 in order to improve posture and decrease pain    Baseline see flowsheet    Time 8    Period Weeks    Status New    Target Date 02/27/21                    Plan - 01/02/21 1456     Clinical Impression Statement Pt is a 30 y/o F who presents to PT with reports of multi-level back pain secondary to large breast size. Physical findings are  consistent with MD impression, as pt demonstrates postural deficits and periscapular muscle weakness contributing to discomfort and decreased functional ability. Her ODI score indicates severe disability and shows pt is operating well below PLOF. She would benefit from skilled PT services working on improving strength in order to decrease pain and improve function. Will assess response to HEP and progress as able.    Personal Factors and Comorbidities Time since onset of injury/illness/exacerbation    Examination-Activity Limitations Squat;Stand;Lift;Reach Overhead;Carry    Examination-Participation Restrictions Yard Work;Occupation;Cleaning;Community Activity    Stability/Clinical Decision Making Stable/Uncomplicated    Clinical Decision Making Low    Rehab Potential Good    PT Frequency 1x / week    PT Duration 8 weeks    PT Treatment/Interventions ADLs/Self Care Home Management;Electrical Stimulation;Cryotherapy;Moist Heat;Ultrasound;Gait training;Stair training;Functional mobility training;Therapeutic activities;Therapeutic exercise;Balance training;Neuromuscular re-education;Cognitive remediation;Patient/family education;Manual techniques    PT Next Visit Plan assess response to HEP; progress as able    PT Home Exercise Plan Access Code: [email or web address]    Consulted and Agree with Plan of Care Patient             Patient will benefit from skilled therapeutic intervention in order to improve the following deficits and impairments:  Decreased activity tolerance, Decreased endurance, Decreased mobility, Decreased strength, Increased muscle spasms, Postural dysfunction, Pain  Visit Diagnosis: Pain in thoracic spine - Plan: PT plan of care cert/re-cert  Cervicalgia - Plan: PT plan of care cert/re-cert  Muscle weakness (generalized) - Plan: PT plan of care cert/re-cert  Chronic low back pain, unspecified back pain laterality, unspecified whether sciatica present - Plan: PT plan of care  cert/re-cert     Problem List Patient Active Problem List   Diagnosis Date Noted   Back pain 12/20/2020   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 [place], [place], [address] Phone: [phone]   Fax:  [phone]  Name: DONTA MCINROY MRN: [id number] Date of Birth: 1991/05/19

## 2021-01-09 ENCOUNTER — Ambulatory Visit: Payer: BC Managed Care – PPO | Admitting: Physical Therapy

## 2021-01-10 ENCOUNTER — Telehealth: Payer: Self-pay | Admitting: Physical Therapy

## 2021-01-10 NOTE — Telephone Encounter (Signed)
Contacted patient regarding No show to Appointment. She reports that she did not see the appointment on her my  chart application. She was reminder of her future dates and times and she stated she planned to attend.

## 2021-01-16 ENCOUNTER — Other Ambulatory Visit: Payer: Self-pay

## 2021-01-16 ENCOUNTER — Ambulatory Visit: Payer: BC Managed Care – PPO

## 2021-01-16 DIAGNOSIS — M546 Pain in thoracic spine: Secondary | ICD-10-CM

## 2021-01-16 DIAGNOSIS — M545 Low back pain, unspecified: Secondary | ICD-10-CM

## 2021-01-16 DIAGNOSIS — M542 Cervicalgia: Secondary | ICD-10-CM

## 2021-01-16 DIAGNOSIS — G8929 Other chronic pain: Secondary | ICD-10-CM

## 2021-01-16 DIAGNOSIS — M6281 Muscle weakness (generalized): Secondary | ICD-10-CM

## 2021-01-16 NOTE — Therapy (Signed)
[facility] Outpatient [facility] [address] [place], [place], [address] Phone: [phone]   Fax:  [phone]  Physical Therapy Treatment  Patient Details  Name: AVIYANNA COLBAUGH MRN: [id number] Date of Birth: Feb 13, 1991 Referring Provider (PT): Peggye Form, DO   Encounter Date: 01/16/2021   PT End of Session - 01/16/21 0913     Visit Number 2    Number of Visits 6    Date for PT Re-Evaluation 02/27/21    Authorization Type BCBS    PT Start Time 0915    PT Stop Time 0954    PT Time Calculation (min) 39 min    Activity Tolerance Patient tolerated treatment well;No increased pain    Behavior During Therapy WFL for tasks assessed/performed             Past Medical History:  Diagnosis Date   Anemia    Asthma     Past Surgical History:  Procedure Laterality Date   FOOT SURGERY      There were no vitals filed for this visit.   Subjective Assessment - 01/16/21 0914     Subjective Pt presents to PT with reports of continued lower back pain. Pt has been fairly compliant with her HEP with no adverse effects noted. Pt is ready to begin PT treatment at this time.    Currently in Pain? Yes    Pain Score 2     Pain Location Back    Pain Orientation Lower           [facility] Adult PT Treatment/Exercise:   Therapeutic Exercise:  UBE lvl 1.0 x 4 min while taking subjective Rows 2x12 7lbs Shoulder ext w/ scap retraction 2x10 7lbs Doorway pec stretch 2x30 sec Supine PPT x 10 - 5 sec hold 90/90 hold 3x20 sec Bridge 3x10 - 3 sec hold Open book x 15 ea                              PT Short Term Goals - 01/02/21 1156       PT SHORT TERM GOAL #1   Title Pt will be compliant and knowledgeable with initial HEP in order to improve comfort and carryover    Baseline initial HEP given    Time 3    Period Weeks    Status New    Target Date 01/23/21      PT SHORT TERM GOAL #2   Title Pt will increase  reps in 30 Sec STS to no less than 15 with no increase in pain in order to improve strength and functional mobility    Baseline 30 Sec STS: 9 reps w/ 8/10 LBP increase    Time 3    Period Weeks    Status New    Target Date 01/23/21               PT Long Term Goals - 01/02/21 1157       PT LONG TERM GOAL #1   Title Pt will decrease ODI score to no greater than 40% in order to improve confidence and functional ability    Baseline 62% disability    Time 8    Period Weeks    Status New    Target Date 02/27/21      PT LONG TERM GOAL #2   Title Pt will self report back pain no greater than 3/10 at worst in order to improve comfort  and functional ability    Baseline 9/10 at worst    Time 8    Period Weeks    Status New    Target Date 02/27/21      PT LONG TERM GOAL #3   Title Pt will increase periscapular strength to 5/5 in order to improve posture and decrease pain    Baseline see flowsheet    Time 8    Period Weeks    Status New    Target Date 02/27/21                   Plan - 01/16/21 0919     Clinical Impression Statement Pt was able to complete prescribed exercises with no adverse effect. She shows improving core and proximal hip strength, with today's session focusing on increasing those areas along with periscapular musculature. Pt continues to benefit from skilled PT services and will continue to be seen and proressed as toelrated.    PT Treatment/Interventions ADLs/Self Care Home Management;Electrical Stimulation;Cryotherapy;Moist Heat;Ultrasound;Gait training;Stair training;Functional mobility training;Therapeutic activities;Therapeutic exercise;Balance training;Neuromuscular re-education;Cognitive remediation;Patient/family education;Manual techniques    PT Next Visit Plan assess response to HEP; progress as able    PT Home Exercise Plan Access Code: [email or web address]             Patient will benefit from skilled therapeutic intervention in order to improve  the following deficits and impairments:  Decreased activity tolerance, Decreased endurance, Decreased mobility, Decreased strength, Increased muscle spasms, Postural dysfunction, Pain  Visit Diagnosis: Pain in thoracic spine  Cervicalgia  Muscle weakness (generalized)  Chronic low back pain, unspecified back pain laterality, unspecified whether sciatica present     Problem List Patient Active Problem List   Diagnosis Date Noted   Back pain 12/20/2020   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 [address] [place], [place], [address] Phone: [phone]   Fax:  [phone]  Name: KIELYN KARDELL MRN: [id number] Date of Birth: 1990/12/26

## 2021-01-30 ENCOUNTER — Ambulatory Visit: Payer: BC Managed Care – PPO | Attending: Internal Medicine

## 2021-01-30 ENCOUNTER — Other Ambulatory Visit: Payer: Self-pay

## 2021-01-30 DIAGNOSIS — M542 Cervicalgia: Secondary | ICD-10-CM | POA: Diagnosis present

## 2021-01-30 DIAGNOSIS — M6281 Muscle weakness (generalized): Secondary | ICD-10-CM | POA: Diagnosis present

## 2021-01-30 DIAGNOSIS — M546 Pain in thoracic spine: Secondary | ICD-10-CM | POA: Insufficient documentation

## 2021-01-30 DIAGNOSIS — G8929 Other chronic pain: Secondary | ICD-10-CM | POA: Insufficient documentation

## 2021-01-30 DIAGNOSIS — M545 Low back pain, unspecified: Secondary | ICD-10-CM | POA: Diagnosis present

## 2021-01-30 NOTE — Therapy (Signed)
[facility] Outpatient [facility] [address] [place], [place], [address] Phone: [phone]   Fax:  [phone]  Physical Therapy Treatment  Patient Details  Name: Claudia Stewart MRN: [id number] Date of Birth: 1991-04-02 Referring Provider (PT): Peggye Form, DO   Encounter Date: 01/30/2021   PT End of Session - 01/30/21 0922     Visit Number 3    Number of Visits 6    Date for PT Re-Evaluation 02/27/21    Authorization Type BCBS    PT Start Time [phone]   arrived late   PT Stop Time 0955    PT Time Calculation (min) 32 min    Activity Tolerance Patient tolerated treatment well;No increased pain    Behavior During Therapy WFL for tasks assessed/performed             Past Medical History:  Diagnosis Date   Anemia    Asthma     Past Surgical History:  Procedure Laterality Date   FOOT SURGERY      There were no vitals filed for this visit.   Subjective Assessment - 01/30/21 0923     Subjective Pt presents to PT with continued mid and lower back pain. She is ready to begin PT at this time.    Currently in Pain? Yes    Pain Score 5     Pain Location Back    Pain Orientation Mid;Lower           [facility] Adult PT Treatment/Exercise:   Therapeutic Exercise:  UBE lvl 1.5 x 4 min while taking subjective Total gym rows 2x12 25lbs Doorway pec stretch 2x30 sec Supine PPT x 10 - 5 sec hold 90/90 hold 2x30 sec 90/90 alt taps 2x10 Bridge 2x15 - 3 sec hold Open book x 10 ea Cat-Cow 2x10  Not Performed Today:  Rows 2x12 7lbs Shoulder ext w/ scap retraction 2x10 7lbs                               PT Short Term Goals - 01/02/21 1156       PT SHORT TERM GOAL #1   Title Pt will be compliant and knowledgeable with initial HEP in order to improve comfort and carryover    Baseline initial HEP given    Time 3    Period Weeks    Status New    Target Date 01/23/21      PT SHORT TERM GOAL #2    Title Pt will increase reps in 30 Sec STS to no less than 15 with no increase in pain in order to improve strength and functional mobility    Baseline 30 Sec STS: 9 reps w/ 8/10 LBP increase    Time 3    Period Weeks    Status New    Target Date 01/23/21               PT Long Term Goals - 01/02/21 1157       PT LONG TERM GOAL #1   Title Pt will decrease ODI score to no greater than 40% in order to improve confidence and functional ability    Baseline 62% disability    Time 8    Period Weeks    Status New    Target Date 02/27/21      PT LONG TERM GOAL #2   Title Pt will self report back pain no greater than 3/10 at  worst in order to improve comfort and functional ability    Baseline 9/10 at worst    Time 8    Period Weeks    Status New    Target Date 02/27/21      PT LONG TERM GOAL #3   Title Pt will increase periscapular strength to 5/5 in order to improve posture and decrease pain    Baseline see flowsheet    Time 8    Period Weeks    Status New    Target Date 02/27/21                   Plan - 01/30/21 0929     Clinical Impression Statement Pt was able to complete prescribed exercises with no adverse effect or increase in baseline. She continues to show improving strength and funcitonal activity tolerance. PT will continue to progress thoracic mobility as well as periscapular and core strength in order to reduce pain and improve comfort.    PT Treatment/Interventions ADLs/Self Care Home Management;Electrical Stimulation;Cryotherapy;Moist Heat;Ultrasound;Gait training;Stair training;Functional mobility training;Therapeutic activities;Therapeutic exercise;Balance training;Neuromuscular re-education;Cognitive remediation;Patient/family education;Manual techniques    PT Next Visit Plan assess response to HEP; progress as able    PT Home Exercise Plan Access Code: [email or web address]             Patient will benefit from skilled therapeutic intervention in order to  improve the following deficits and impairments:  Decreased activity tolerance, Decreased endurance, Decreased mobility, Decreased strength, Increased muscle spasms, Postural dysfunction, Pain  Visit Diagnosis: Pain in thoracic spine  Cervicalgia  Muscle weakness (generalized)  Chronic low back pain, unspecified back pain laterality, unspecified whether sciatica present     Problem List Patient Active Problem List   Diagnosis Date Noted   Back pain 12/20/2020   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

## 2021-02-06 ENCOUNTER — Other Ambulatory Visit: Payer: Self-pay

## 2021-02-06 ENCOUNTER — Ambulatory Visit: Payer: BC Managed Care – PPO

## 2021-02-06 DIAGNOSIS — M545 Low back pain, unspecified: Secondary | ICD-10-CM

## 2021-02-06 DIAGNOSIS — M546 Pain in thoracic spine: Secondary | ICD-10-CM | POA: Diagnosis not present

## 2021-02-06 DIAGNOSIS — M542 Cervicalgia: Secondary | ICD-10-CM

## 2021-02-06 DIAGNOSIS — M6281 Muscle weakness (generalized): Secondary | ICD-10-CM

## 2021-02-06 DIAGNOSIS — G8929 Other chronic pain: Secondary | ICD-10-CM

## 2021-02-06 NOTE — Therapy (Signed)
[facility] Outpatient Rehabilitation [facility] [address] [place], [place], [address] Phone: [phone]   Fax:  [phone]  Physical Therapy Treatment  Patient Details  Name: STARLINA LAPRE MRN: [id number] Date of Birth: [birth year] Referring Provider (PT): Peggye Form, DO   Encounter Date: 02/06/2021   PT End of Session - 02/06/21 0915     Visit Number 4    Number of Visits 6    Date for PT Re-Evaluation 02/27/21    Authorization Type BCBS    PT Start Time 0916    PT Stop Time 0957    PT Time Calculation (min) 41 min    Activity Tolerance Patient tolerated treatment well;No increased pain    Behavior During Therapy WFL for tasks assessed/performed             Past Medical History:  Diagnosis Date   Anemia    Asthma     Past Surgical History:  Procedure Laterality Date   FOOT SURGERY      There were no vitals filed for this visit.   Subjective Assessment - 02/06/21 0915     Subjective Pt presents to PT with reports of continued mid and lower back pain and had sharp increase in pain over the weekend. Ready to begin PT at this time.    Currently in Pain? Yes    Pain Score 7     Pain Location Back    Pain Orientation Mid;Lower           [facility] Adult PT Treatment/Exercise:   Therapeutic Exercise:  UBE lvl 1.5 x 4 min while taking subjective (70fwd/2bwd) Cybex rows 2x12 10lbs Shoulder ext w/ scap retraction 2x10 7lbs Total gym rows 2x10 30lbs Supine PPT x 10 - 5 sec hold Supine PPT w/ shoulder ext 2x10 blue tband 90/90 hold 2x30 sec 90/90 alt taps 2x10 Bridge 2x15 - 3 sec hold Open book x 15 ea Cat-Cow x 10   Not Performed Today:  Doorway pec stretch 2x30 sec                               PT Short Term Goals - 02/06/21 0923       PT SHORT TERM GOAL #1   Title Pt will be compliant and knowledgeable with initial HEP in order to improve comfort and carryover    Baseline initial HEP given     Time 3    Period Weeks    Status Achieved    Target Date 01/23/21      PT SHORT TERM GOAL #2   Title Pt will increase reps in 30 Sec STS to no less than 15 with no increase in pain in order to improve strength and functional mobility    Baseline 30 Sec STS: 9 reps w/ 8/10 LBP increase; 9/19 - 10 reps, continued LBP    Time 3    Period Weeks    Status On-going    Target Date 01/23/21               PT Long Term Goals - 01/02/21 1157       PT LONG TERM GOAL #1   Title Pt will decrease ODI score to no greater than 40% in order to improve confidence and functional ability    Baseline 62% disability    Time 8    Period Weeks    Status New    Target Date 02/27/21  PT LONG TERM GOAL #2   Title Pt will self report back pain no greater than 3/10 at worst in order to improve comfort and functional ability    Baseline 9/10 at worst    Time 8    Period Weeks    Status New    Target Date 02/27/21      PT LONG TERM GOAL #3   Title Pt will increase periscapular strength to 5/5 in order to improve posture and decrease pain    Baseline see flowsheet    Time 8    Period Weeks    Status New    Target Date 02/27/21                   Plan - 02/06/21 0929     Clinical Impression Statement Pt again able to complete prescribed exercises, no change in baseline pain. Today therpay continued to focus on increasing periscapluar and core muscle strength in order to decrease pain and improve posture. She continues to show improving strength and functional activity tolerance. Will continue to progress exercises as tolerated per POC.    PT Treatment/Interventions ADLs/Self Care Home Management;Electrical Stimulation;Cryotherapy;Moist Heat;Ultrasound;Gait training;Stair training;Functional mobility training;Therapeutic activities;Therapeutic exercise;Balance training;Neuromuscular re-education;Cognitive remediation;Patient/family education;Manual techniques    PT Next Visit Plan  assess response to HEP; progress as able    PT Home Exercise Plan Access Code: [email or web address]             Patient will benefit from skilled therapeutic intervention in order to improve the following deficits and impairments:  Decreased activity tolerance, Decreased endurance, Decreased mobility, Decreased strength, Increased muscle spasms, Postural dysfunction, Pain  Visit Diagnosis: Pain in thoracic spine  Cervicalgia  Muscle weakness (generalized)  Chronic low back pain, unspecified back pain laterality, unspecified whether sciatica present     Problem List Patient Active Problem List   Diagnosis Date Noted   Back pain 12/20/2020   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

## 2021-02-15 ENCOUNTER — Ambulatory Visit: Payer: BC Managed Care – PPO

## 2021-06-23 ENCOUNTER — Ambulatory Visit: Payer: BC Managed Care – PPO | Admitting: Surgical

## 2021-08-14 NOTE — Progress Notes (Deleted)
? ?  Referring Provider ?Iver Nestle, PA-C ?[id number] ?[address] ?[place],  [place] [address]  ? ?CC: No chief complaint on file. ?   ? ?Claudia Stewart is an 31 y.o. female.  ?HPI: 31 year old female here for follow-up after consultation for symptomatic mammary hypertrophy on 12/20/2020 with Dr. Ulice Bold.  She has completed 4 visits of physical therapy since her appointment with [place] on 12/20/2020. ? ?She reports today*** ? ?Review of Systems ?General: *** ? ?Physical Exam ? ?  12/20/2020  ?  8:28 AM 03/31/2015  ?  7:17 PM 05/03/2014  ? 10:44 PM  ?Vitals with BMI  ?Height 5\' 4"  5\' 4"    ?Weight 201 lbs 170 lbs   ?BMI 34.48 29.2   ?Systolic 200 173  ?Diastolic 138 101 80  ?Pulse 78 90 85  ?  ?General:  No acute distress,  Alert and oriented, Non-Toxic, Normal speech and affect ?***  ? ?Assessment/Plan ?*** ? ? Rc Amison ?08/14/2021, 12:04 PM  ? ? ?    ?

## 2021-08-15 ENCOUNTER — Ambulatory Visit: Payer: BC Managed Care – PPO | Admitting: Surgical
